# Patient Record
Sex: Female | Born: 1935 | Race: White | Hispanic: No | Marital: Married | Smoking: Former smoker
Health system: Southern US, Community
[De-identification: ages and names within clinical notes are randomized; demographics above are authoritative.]

## PROBLEM LIST (undated history)

## (undated) DIAGNOSIS — I714 Abdominal aortic aneurysm, without rupture, unspecified: Secondary | ICD-10-CM

## (undated) DIAGNOSIS — E039 Hypothyroidism, unspecified: Secondary | ICD-10-CM

## (undated) DIAGNOSIS — M069 Rheumatoid arthritis, unspecified: Secondary | ICD-10-CM

## (undated) DIAGNOSIS — I480 Paroxysmal atrial fibrillation: Secondary | ICD-10-CM

## (undated) DIAGNOSIS — S32000A Wedge compression fracture of unspecified lumbar vertebra, initial encounter for closed fracture: Secondary | ICD-10-CM

## (undated) DIAGNOSIS — R918 Other nonspecific abnormal finding of lung field: Secondary | ICD-10-CM

## (undated) HISTORY — DX: Other nonspecific abnormal finding of lung field: R91.8

## (undated) HISTORY — DX: Abdominal aortic aneurysm, without rupture, unspecified: I71.40

## (undated) HISTORY — PX: ABDOMINAL HYSTERECTOMY: SHX81

## (undated) HISTORY — DX: Hypothyroidism, unspecified: E03.9

## (undated) HISTORY — DX: Wedge compression fracture of unspecified lumbar vertebra, initial encounter for closed fracture: S32.000A

## (undated) HISTORY — DX: Abdominal aortic aneurysm, without rupture: I71.4

## (undated) HISTORY — DX: Rheumatoid arthritis, unspecified: M06.9

## (undated) HISTORY — PX: TONSILLECTOMY: SUR1361

## (undated) HISTORY — PX: CHOLECYSTECTOMY: SHX55

## (undated) HISTORY — DX: Paroxysmal atrial fibrillation: I48.0

---

## 2010-06-20 ENCOUNTER — Inpatient Hospital Stay (HOSPITAL_COMMUNITY)
Admission: AD | Admit: 2010-06-20 | Discharge: 2010-06-23 | Payer: Self-pay | Source: Home / Self Care | Admitting: Internal Medicine

## 2010-06-20 ENCOUNTER — Ambulatory Visit: Payer: Self-pay | Admitting: Internal Medicine

## 2010-06-20 ENCOUNTER — Ambulatory Visit: Payer: Self-pay | Admitting: Cardiology

## 2010-06-22 ENCOUNTER — Ambulatory Visit: Payer: Self-pay | Admitting: Vascular Surgery

## 2010-07-07 ENCOUNTER — Encounter: Payer: Self-pay | Admitting: Cardiology

## 2010-07-11 ENCOUNTER — Encounter: Payer: Self-pay | Admitting: Cardiology

## 2010-07-11 ENCOUNTER — Ambulatory Visit: Payer: Self-pay | Admitting: Cardiology

## 2010-07-11 DIAGNOSIS — I714 Abdominal aortic aneurysm, without rupture: Secondary | ICD-10-CM

## 2010-07-11 DIAGNOSIS — R911 Solitary pulmonary nodule: Secondary | ICD-10-CM

## 2010-07-11 DIAGNOSIS — I4891 Unspecified atrial fibrillation: Secondary | ICD-10-CM

## 2010-09-06 NOTE — Assessment & Plan Note (Signed)
Summary: nph/a-fib/lg   Primary Provider:  Dr. Donata Duff   History of Present Illness: 77 yo with history of rheumatoid arthritis presents for followup of newly-diagnosed paroxysmal atrial fibrillation.  Patient went to [facility] in 11/11 with symptomatic atrial fibrillation with rapid ventricular resopnse.  She initially received IV diltiazem and became hypotensive, requiring pressors.  She was transferred to [facility].  At [facility], she converted back to NSR, then went back into atrial fibrillation.  She was rate controlled and anticoagulated.  We discussed starting Pradaxa, but she wanted to go on coumadin instead because of costs, so coumadin was started.  Echo showed EF 50-55% with no wall motion abnormalities and elevated PA pressure consistent with diastolic CHF.  Since discharge, she has been doing quite well.  She is in sinus rhythm today.  No episodes of racing heart rate or palpitations.  Coumadin is followed in [place].  No chest pain.  She is doing housework and walking on flat ground without dyspnea.    ECG: NSR, delayed R wave progression  Labs (11/11): K 3.4, creatinine 1, TSH normal, cardiac enzymes negative, LDL 73, HDL 37  Current Medications (verified): 1)  Warfarin Sodium 5 Mg Tabs (Warfarin Sodium) .... One Daily or As Directed 2)  Levoxyl 125 Mcg Tabs (Levothyroxine Sodium) .... One Daily 3)  Nortriptyline Hcl 25 Mg Caps (Nortriptyline Hcl) .Marland Kitchen.. 1 By Mouth Daily 4)  Folic Acid 400 Mcg Tabs (Folic Acid) .Marland Kitchen.. 1 By Mouth Daily 5)  Calcium 500 Mg Tabs (Calcium) .Marland Kitchen.. 1 By Mouth Daily 6)  Vitamin D 400 Unit Caps (Cholecalciferol) .Marland Kitchen.. 1 By Mouth Daily 7)  Toprol Xl 50 Mg Xr24h-Tab (Metoprolol Succinate) .Marland Kitchen.. 1 By Mouth Daily 8)  Zyrtec Allergy 10 Mg Tabs (Cetirizine Hcl) .... As Needed 9)  Cymbalta 60 Mg Cpep (Duloxetine Hcl) .Marland Kitchen.. 1 By Mouth Dialy 10)  Tramadol Hcl 50 Mg Tabs (Tramadol Hcl) .... As Needed 11)  Lunesta 3 Mg Tabs (Eszopiclone) .... As  Needed  Allergies (verified): 1)  ! Sulfa  Past History:  Past Medical History:  1. Paroxysmal atrial fibrillation with rapid ventricular response.  CHADSVASC score = 3 so needs anticoagulation. Echo (11/11): Mild LV hypertrophy, EF 50-55% without wall motion abnormalities, mild MR, PA systolic pressure 55 mmHg (moderately elevated).   2. Rheumatoid arthritis 3. Lung nodules on chest CT (11/11) 4. Hypothyroidism 5. h/o cholecystectomy 6. h/o hysterectomy 7. AAA: 3.4 cm on abdominal CT in 11/11 8. Lumbar compression fracture  Family History: No premature CAD, no atrial fibrillation  Social History: Reviewed history from 07/08/2010 and no changes required. The patient lives in [place] with her husband.  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  She is a former smoker.  She quit in 1989.   No alcohol or drug use  Review of Systems       All systems reviewed and negative except as per HPI.   Vital Signs:  Patient profile:   77 year old female Height:      66 inches Weight:      153 pounds BMI:     24.78 Pulse rate:   59 / minute Resp:     16 per minute BP sitting:   132 / 70  (right arm)  Vitals Entered By: Marrion Coy, CNA (July 11, 2010 11:40 AM)  Physical Exam  General:  Well developed, well nourished, in no acute distress. Head:  normocephalic and atraumatic Nose:  no deformity, discharge,  inflammation, or lesions Mouth:  Teeth, gums and palate normal. Oral mucosa normal. Neck:  Neck supple, no JVD. No masses, thyromegaly or abnormal cervical nodes. Lungs:  Clear bilaterally to auscultation and percussion. Heart:  Non-displaced PMI, chest non-tender; regular rate and rhythm, S1, S2 without murmurs, rubs or gallops. Carotid upstroke normal, no bruit. Pedals normal pulses. No edema, no varicosities. Abdomen:  Bowel sounds positive; abdomen soft and non-tender without masses, organomegaly, or hernias noted. No hepatosplenomegaly. Extremities:   No clubbing or cyanosis. Neurologic:  Alert and oriented x 3. Skin:  Mild bruising Psych:  Normal affect.   Impression & Recommendations:  Problem # 1:  ATRIAL FIBRILLATION (ICD-427.31) Paroxysmal.  She is currently in NSR.  She has had no dyspnea, chest pain, or palpitations since discharge.  Continue Toprol XL.  She is now interested in dabigatran (suspect that the cost compared to coumadin will be similar when taking into account the INR checks).  She has a CHADSVASC score of 3, so I think she would benefit from anticoagulation.  I will have her stop coumadin now and begin dabigatran when INR<2.  Her creatinine is normal. CBC and BMET after being on dabigatran for 2 wks.   Problem # 2:  PULMONARY NODULE (ICD-518.89) Given history of smoking, noncontrast chest CT in 5/12 to evaluate.   Problem # 3:  AAA (ICD-441.4) 3.4 cm infrarenal AAA in 11/11 on CT abdomen.  Will get abdominal US to follow in 11/12.   Followup in 3 months.    Other Orders: CT Scan  (CT Scan) Abdominal Aorta Duplex (Abd Aorta Duplex)  Patient Instructions: 1)  Your physician has recommended you make the following change in your medication:  2)  Change from Coumadin to Pradaxa when your INR is less than 2--when your INR is less than 2 start Pradaxa 150mg  twice a day. 3)  Have lab done 10 days after you change from Coumadin(warfarin) to Pradaxa---BMP/CBC  427.31--you have the order-please fax the results to [phone]. 4)  Your physician recommends that you schedule a follow-up appointment in: 3 months with Dr Shirlee Latch. 5)  Non-Cardiac CT scanning, (CAT scanning), is a noninvasive, special x-ray that produces cross-sectional images of the body using x-rays and a computer. CT scans help physicians diagnose and treat medical conditions. For some CT exams, a contrast material is used to enhance visibility in the area of the body being studied. CT scans provide greater clarity and reveal more details than regular x-ray  exams. CHEST CT in MAY 2012 to followup on pulmonary nodule 6)  Your physician has requested that you have an abdominal aorta duplex. During this test, an ultrasound is used to evaluate the aorta. Allow 30 minutes for this exam. Do not eat after midnight the day before and avoid carbonated beverages. There are no restrictions or special instructions.  NOVEMBER 2012 Prescriptions: PRADAXA 150 MG CAPS (DABIGATRAN ETEXILATE MESYLATE) one twice a day--DO NOT START UNTIL YOUR INR IS LESS THAN 2  #60 x 0   Entered by:   Katina Dung, RN, BSN   Authorized by:   [name], MD   Signed by:   Katina Dung, RN, BSN on 07/11/2010   Method used:   Electronically to        [id number] (retail)       [address]       [place], [place]  [address]       Ph: [phone]       Fax: [phone]  RxID:   [id number]

## 2010-09-06 NOTE — Consult Note (Signed)
Summary: Consultation Report  Consultation Report   Imported By: Earl Many 07/07/2010 14:51:59  _____________________________________________________________________  External Attachment:    Type:   Image     Comment:   External Document

## 2010-10-04 ENCOUNTER — Other Ambulatory Visit: Payer: Self-pay | Admitting: Cardiology

## 2010-10-04 ENCOUNTER — Encounter: Payer: Self-pay | Admitting: Cardiology

## 2010-10-04 ENCOUNTER — Ambulatory Visit (INDEPENDENT_AMBULATORY_CARE_PROVIDER_SITE_OTHER): Payer: Medicare Other | Admitting: Cardiology

## 2010-10-04 DIAGNOSIS — I4891 Unspecified atrial fibrillation: Secondary | ICD-10-CM

## 2010-10-04 DIAGNOSIS — R911 Solitary pulmonary nodule: Secondary | ICD-10-CM

## 2010-10-05 LAB — BASIC METABOLIC PANEL
BUN: 19 mg/dL (ref 6–23)
CO2: 29 mEq/L (ref 19–32)
Chloride: 104 mEq/L (ref 96–112)
Creatinine, Ser: 1.1 mg/dL (ref 0.4–1.2)
Potassium: 4.9 mEq/L (ref 3.5–5.1)

## 2010-10-05 LAB — CBC WITH DIFFERENTIAL/PLATELET
Basophils Relative: 0.3 % (ref 0.0–3.0)
Eosinophils Absolute: 0.2 10*3/uL (ref 0.0–0.7)
HCT: 38.3 % (ref 36.0–46.0)
Lymphs Abs: 1.9 10*3/uL (ref 0.7–4.0)
MCHC: 34 g/dL (ref 30.0–36.0)
MCV: 97 fl (ref 78.0–100.0)
Monocytes Absolute: 0.3 10*3/uL (ref 0.1–1.0)
Neutro Abs: 4.4 10*3/uL (ref 1.4–7.7)
Neutrophils Relative %: 64.3 % (ref 43.0–77.0)
RBC: 3.95 Mil/uL (ref 3.87–5.11)

## 2010-10-13 NOTE — Assessment & Plan Note (Signed)
Summary: 3 MONTH ROV/SL/RS PER PT-MJ/AMD   Primary Provider:  Dr. Donata Duff   History of Present Illness: 77 yo with history of rheumatoid arthritis presents for followup of paroxysmal atrial fibrillation.  Patient went to [facility] in 11/11 with symptomatic atrial fibrillation with rapid ventricular resopnse.  She initially received IV diltiazem and became hypotensive, requiring pressors.  She was transferred to [facility].  At [facility], she converted back to NSR, then went back into atrial fibrillation.  Echo showed EF 50-55% with no wall motion abnormalities and elevated PA pressure consistent with diastolic CHF.  She is now on Pradaxa. At this appointment and the last appointment, she has been in NSR.  She denies tachypalpitations. No chest pain or significant exertional dyspnea.   Labs (11/11): K 3.4, creatinine 1, TSH normal, cardiac enzymes negative, LDL 73, HDL 37  Current Medications (verified): 1)  Pradaxa 150 Mg Caps (Dabigatran Etexilate Mesylate) .... One Twice A Day--Do Not Start Until Your Inr Is Less Than 2 2)  Levoxyl 125 Mcg Tabs (Levothyroxine Sodium) .... One Daily 3)  Nortriptyline Hcl 25 Mg Caps (Nortriptyline Hcl) .Marland Kitchen.. 1 By Mouth Daily 4)  Folic Acid 400 Mcg Tabs (Folic Acid) .Marland Kitchen.. 1 By Mouth Daily 5)  Calcium 500 Mg Tabs (Calcium) .Marland Kitchen.. 1 By Mouth Daily 6)  Vitamin D 400 Unit Caps (Cholecalciferol) .Marland Kitchen.. 1 By Mouth Daily 7)  Toprol Xl 50 Mg Xr24h-Tab (Metoprolol Succinate) .Marland Kitchen.. 1 By Mouth Daily 8)  Zyrtec Allergy 10 Mg Tabs (Cetirizine Hcl) .... As Needed 9)  Tramadol Hcl 50 Mg Tabs (Tramadol Hcl) .... As Needed  Allergies (verified): 1)  ! Sulfa  Past History:  Past Medical History: Reviewed history from 07/11/2010 and no changes required.  1. Paroxysmal atrial fibrillation with rapid ventricular response.  CHADSVASC score = 3 so needs anticoagulation. Echo (11/11): Mild LV hypertrophy, EF 50-55% without wall motion abnormalities, mild MR, PA  systolic pressure 55 mmHg (moderately elevated).   2. Rheumatoid arthritis 3. Lung nodules on chest CT (11/11) 4. Hypothyroidism 5. h/o cholecystectomy 6. h/o hysterectomy 7. AAA: 3.4 cm on abdominal CT in 11/11 8. Lumbar compression fracture  Family History: Reviewed history from 07/11/2010 and no changes required. No premature CAD, no atrial fibrillation  Social History: Reviewed history from 07/08/2010 and no changes required. The patient lives in [place] with her husband.  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  She is a former smoker.  She quit in 1989.   No alcohol or drug use  Vital Signs:  Patient profile:   77 year old female Height:      66 inches Weight:      147 pounds BMI:     23.81 Pulse rate:   80 / minute Resp:     16 per minute BP sitting:   139 / 78  (right arm)  Vitals Entered By: Marrion Coy, CNA (October 04, 2010 2:45 PM)  Physical Exam  General:  Well developed, well nourished, in no acute distress. Neck:  Neck supple, no JVD. No masses, thyromegaly or abnormal cervical nodes. Lungs:  Clear bilaterally to auscultation and percussion. Heart:  Non-displaced PMI, chest non-tender; regular rate and rhythm, S1, S2 without murmurs, rubs or gallops. Carotid upstroke normal, no bruit. Pedals normal pulses. No edema, no varicosities. Abdomen:  Bowel sounds positive; abdomen soft and non-tender without masses, organomegaly, or hernias noted. No hepatosplenomegaly. Extremities:  No clubbing or cyanosis. Neurologic:  Alert and oriented x 3.  Psych:  Normal affect.   Impression & Recommendations:  Problem # 1:  ATRIAL FIBRILLATION (ICD-427.31) Paroxysmal atrial fibrillation with CHADSVASC score = 3.  Would continue Pradaxa and Toprol XL.  Check CBC and BMET on Pradaxa.   Problem # 2:  PULMONARY NODULE (ICD-518.89) Given history of smoking, noncontrast chest CT in 5/12 to evaluate.   Problem # 3:  AAA (ICD-441.4) 3.4 cm infrarenal  AAA in 11/11 on CT abdomen.  Will get abdominal US to follow in 11/12.   Other Orders: Abdominal Aorta Duplex (Abd Aorta Duplex) CT Scan  (CT Scan) TLB-BMP (Basic Metabolic Panel-BMET) (80048-METABOL) TLB-CBC Platelet - w/Differential (85025-CBCD)  Patient Instructions: 1)  Your physician recommends that you have lab today---BMP CBC  427.31 2)  Non-Cardiac CT scanning, (CAT scanning), is a noninvasive, special x-ray that produces cross-sectional images of the body using x-rays and a computer. CT scans help physicians diagnose and treat medical conditions. For some CT exams, a contrast material is used to enhance visibility in the area of the body being studied. CT scans provide greater clarity and reveal more details than regular x-ray exams. CHEST CT MAY 2012 3)  Your physician has requested that you have an abdominal aorta duplex. During this test, an ultrasound is used to evaluate the aorta. Allow 30 minutes for this exam. Do not eat after midnight the day before and avoid carbonated beverages. There are no restrictions or special instructions. NOVEMBER 2012 4)  Your physician wants you to follow-up in: 6 months with Dr Shirlee Latch.[place] No 2012) You will receive a reminder letter in the mail two months in advance. If you don't receive a letter, please call our office to schedule the follow-up appointment.

## 2010-10-18 LAB — URINALYSIS, ROUTINE W REFLEX MICROSCOPIC
Bilirubin Urine: NEGATIVE
Glucose, UA: NEGATIVE mg/dL
Ketones, ur: NEGATIVE mg/dL
Protein, ur: NEGATIVE mg/dL
pH: 5.5 (ref 5.0–8.0)

## 2010-10-18 LAB — BASIC METABOLIC PANEL
BUN: 12 mg/dL (ref 6–23)
BUN: 17 mg/dL (ref 6–23)
CO2: 23 mEq/L (ref 19–32)
CO2: 23 mEq/L (ref 19–32)
CO2: 27 mEq/L (ref 19–32)
Calcium: 8.1 mg/dL — ABNORMAL LOW (ref 8.4–10.5)
Calcium: 8.1 mg/dL — ABNORMAL LOW (ref 8.4–10.5)
Chloride: 108 mEq/L (ref 96–112)
Creatinine, Ser: 0.86 mg/dL (ref 0.4–1.2)
Creatinine, Ser: 1.02 mg/dL (ref 0.4–1.2)
GFR calc Af Amer: >60 mL/min (ref >60)
GFR calc non Af Amer: 52 mL/min — ABNORMAL LOW (ref >60)
GFR calc non Af Amer: 53 mL/min — ABNORMAL LOW (ref >60)
Glucose, Bld: 102 mg/dL — ABNORMAL HIGH (ref 70–99)
Glucose, Bld: 144 mg/dL — ABNORMAL HIGH (ref 70–99)
Glucose, Bld: 95 mg/dL (ref 70–99)
Potassium: 3.4 mEq/L — ABNORMAL LOW (ref 3.5–5.1)
Potassium: 3.9 mEq/L (ref 3.5–5.1)
Sodium: 137 mEq/L (ref 135–145)
Sodium: 139 mEq/L (ref 135–145)

## 2010-10-18 LAB — PROTIME-INR
INR: 1.08 (ref 0.00–1.49)
INR: 1.13 (ref 0.00–1.49)

## 2010-10-18 LAB — T4, FREE: Free T4: 1.46 ng/dL (ref 0.80–1.80)

## 2010-10-18 LAB — CBC
HCT: 32.6 % — ABNORMAL LOW (ref 36.0–46.0)
HCT: 34.6 % — ABNORMAL LOW (ref 36.0–46.0)
Hemoglobin: 11 g/dL — ABNORMAL LOW (ref 12.0–15.0)
Hemoglobin: 11.5 g/dL — ABNORMAL LOW (ref 12.0–15.0)
MCH: 31.6 pg (ref 26.0–34.0)
MCH: 31.6 pg (ref 26.0–34.0)
MCHC: 33.1 g/dL (ref 30.0–36.0)
MCHC: 33.2 g/dL (ref 30.0–36.0)
MCHC: 33.7 g/dL (ref 30.0–36.0)
MCV: 93.7 fL (ref 78.0–100.0)
Platelets: 172 10*3/uL (ref 150–400)
Platelets: 187 10*3/uL (ref 150–400)
RBC: 3.48 MIL/uL — ABNORMAL LOW (ref 3.87–5.11)
RDW: 13.1 % (ref 11.5–15.5)
WBC: 5.1 10*3/uL (ref 4.0–10.5)

## 2010-10-18 LAB — LIPID PANEL
HDL: 37 mg/dL — ABNORMAL LOW (ref >39)
Triglycerides: 106 mg/dL (ref <150)
VLDL: 21 mg/dL (ref 0–40)

## 2010-10-18 LAB — COMPREHENSIVE METABOLIC PANEL
ALT: 10 U/L (ref 0–35)
AST: 19 U/L (ref 0–37)
Albumin: 2.8 g/dL — ABNORMAL LOW (ref 3.5–5.2)
Alkaline Phosphatase: 45 U/L (ref 39–117)
BUN: 20 mg/dL (ref 6–23)
CO2: 23 mEq/L (ref 19–32)
Calcium: 8.1 mg/dL — ABNORMAL LOW (ref 8.4–10.5)
Chloride: 100 mEq/L (ref 96–112)
Creatinine, Ser: 1.15 mg/dL (ref 0.4–1.2)
GFR calc Af Amer: 55 mL/min — ABNORMAL LOW (ref >60)
GFR calc non Af Amer: 45 mL/min — ABNORMAL LOW (ref >60)
Glucose, Bld: 162 mg/dL — ABNORMAL HIGH (ref 70–99)
Potassium: 4.2 mEq/L (ref 3.5–5.1)
Sodium: 130 mEq/L — ABNORMAL LOW (ref 135–145)
Total Bilirubin: 0.6 mg/dL (ref 0.3–1.2)
Total Protein: 6.7 g/dL (ref 6.0–8.3)

## 2010-10-18 LAB — HEMOGLOBIN A1C: Hgb A1c MFr Bld: 6.1 % — ABNORMAL HIGH (ref <5.7)

## 2010-10-18 LAB — RHEUMATOID FACTOR: Rheumatoid fact SerPl-aCnc: 33 IU/mL — ABNORMAL HIGH (ref 0–20)

## 2010-10-18 LAB — CARDIAC PANEL(CRET KIN+CKTOT+MB+TROPI)
CK, MB: 1.1 ng/mL (ref 0.3–4.0)
CK, MB: 1.2 ng/mL (ref 0.3–4.0)
CK, MB: 2.1 ng/mL (ref 0.3–4.0)
Relative Index: INVALID (ref 0.0–2.5)
Relative Index: INVALID (ref 0.0–2.5)
Total CK: 69 U/L (ref 7–177)
Total CK: 71 U/L (ref 7–177)
Total CK: 81 U/L (ref 7–177)
Troponin I: 0.01 ng/mL (ref 0.00–0.06)
Troponin I: 0.03 ng/mL (ref 0.00–0.06)

## 2010-10-18 LAB — VITAMIN D 1,25 DIHYDROXY
Vitamin D 1, 25 (OH)2 Total: 27 pg/mL (ref 18–72)
Vitamin D2 1, 25 (OH)2: <8 pg/mL

## 2010-10-18 LAB — URINE MICROSCOPIC-ADD ON

## 2010-10-18 LAB — CORTISOL: Cortisol, Plasma: 5.4 ug/dL

## 2010-10-18 LAB — VITAMIN B12 BINDING CAPACITY, BLOOD: Vitamin B12 Bind Capacity: 1409 pg/mL — ABNORMAL HIGH (ref 650–1340)

## 2010-10-18 LAB — SEDIMENTATION RATE: Sed Rate: 43 mm/hr — ABNORMAL HIGH (ref 0–22)

## 2010-10-18 LAB — TSH: TSH: 0.762 u[IU]/mL (ref 0.350–4.500)

## 2010-10-25 ENCOUNTER — Ambulatory Visit: Payer: Self-pay | Admitting: Cardiology

## 2010-12-15 ENCOUNTER — Ambulatory Visit (INDEPENDENT_AMBULATORY_CARE_PROVIDER_SITE_OTHER)
Admission: RE | Admit: 2010-12-15 | Discharge: 2010-12-15 | Disposition: A | Discharge status: Home / Self Care | Payer: Medicare Other | Source: Ambulatory Visit | Attending: Cardiology | Admitting: Cardiology

## 2010-12-15 DIAGNOSIS — R911 Solitary pulmonary nodule: Secondary | ICD-10-CM

## 2010-12-15 DIAGNOSIS — J984 Other disorders of lung: Secondary | ICD-10-CM

## 2010-12-20 ENCOUNTER — Other Ambulatory Visit: Payer: Self-pay | Admitting: *Deleted

## 2010-12-20 DIAGNOSIS — R911 Solitary pulmonary nodule: Secondary | ICD-10-CM

## 2011-02-13 ENCOUNTER — Telehealth: Payer: Self-pay | Admitting: Cardiology

## 2011-02-13 NOTE — Telephone Encounter (Signed)
All Cardiac faxed to [facility]/Dr.Amy Csorba  @ [phone]  02/13/11/km

## 2011-03-27 ENCOUNTER — Encounter: Payer: Self-pay | Admitting: Cardiology

## 2011-04-17 ENCOUNTER — Encounter: Payer: Self-pay | Admitting: Cardiology

## 2011-04-17 ENCOUNTER — Ambulatory Visit (INDEPENDENT_AMBULATORY_CARE_PROVIDER_SITE_OTHER): Payer: Medicare Other | Admitting: Cardiology

## 2011-04-17 DIAGNOSIS — I714 Abdominal aortic aneurysm, without rupture: Secondary | ICD-10-CM

## 2011-04-17 DIAGNOSIS — I4891 Unspecified atrial fibrillation: Secondary | ICD-10-CM

## 2011-04-17 DIAGNOSIS — J984 Other disorders of lung: Secondary | ICD-10-CM

## 2011-04-17 NOTE — Assessment & Plan Note (Signed)
Asymptomatic. Scheduled for abdominal ultrasound to re-assess in November 2012.

## 2011-04-17 NOTE — Patient Instructions (Signed)
Your physician recommends that you have lab today--BMP 427.31  Schedule an appointment for an ultrasound of your aorta. November 2012  Schedule an appointment for a CT of your chest. May 2013  Take and record your blood pressure 2-3 times a week. Call our office if the top number of your blood pressure in regularly over 140. Luana Shu [phone]  Your physician wants you to follow-up in: 6 months with Dr Shirlee Latch. (March 2013). You will receive a reminder letter in the mail two months in advance. If you don't receive a letter, please call our office to schedule the follow-up appointment.

## 2011-04-17 NOTE — Assessment & Plan Note (Signed)
Although patient does have history of tobacco abuse, this is likely benign finding based on CT. Will get recommended repeat CT-chest in May 2013.

## 2011-04-17 NOTE — Assessment & Plan Note (Signed)
Paroxysmal atrial fibrillation not in atrial fibrillation today. Asymptomatic. Continue Pradaxa and Toprol at current dose. Will check Cr today since on Pradaxa.  Blood pressure found to be higher than usual for patient whose systolics are usually less than 140. May be element of white coat hypertension. Asked her to check blood pressure a few times in the next week and to call the clinic if consistently above 140s. May need additional anti-hypertensive.  Follow-up in 6 months.

## 2011-04-17 NOTE — Progress Notes (Signed)
  Subjective:    Patient ID: Debbie Parrish, female    DOB: 15-Dec-1926, 77 y.o.   MRN: [id number]  HPI Patient with history of paroxysmal atrial fibrillation diagnosed November 2011 when she presented to outside hospital with symptomatic atrial fibrillation with RVR. Came to the ED due to severe shoulder pain. Last seen in February 2012 by Dr. Shirlee Latch. Patient reports no new complaints since then. Denies chest pain, palpitations, dyspnea, lower extremity swelling.  She reports compliance with Pradaxa and Toprol.   Not smoking.  Review of Systems See HPI.    Objective:   Physical Exam Gen: NAD Psych: somewhat anxious appearing CV: RRR, normal S1/S2 with no murmurs or gallops; no JVD or carotid bruits auscultated Pulm: CTAB, no wheezes or rales Ext: no cyanosis or pedal edema MSK: no joint erythema or swelling    Assessment & Plan:

## 2011-04-18 LAB — BASIC METABOLIC PANEL
BUN: 25 mg/dL — ABNORMAL HIGH (ref 6–23)
Chloride: 101 mEq/L (ref 96–112)
Potassium: 4.4 mEq/L (ref 3.5–5.1)
Sodium: 138 mEq/L (ref 135–145)

## 2011-04-18 NOTE — Progress Notes (Signed)
I saw the patient with the resident and agree with note.  No tachypalpitations concerning for recurrent atrial fibrillation.  She is in NSR today.  - f/u chest CT w/o contrast in 5/13 - f/u abdominal US in 11/12 for AAA - BMET today - Continue Pradaxa.

## 2011-04-26 ENCOUNTER — Other Ambulatory Visit: Payer: Self-pay | Admitting: Cardiology

## 2011-06-22 ENCOUNTER — Telehealth: Payer: Self-pay | Admitting: Cardiology

## 2011-06-22 ENCOUNTER — Encounter (INDEPENDENT_AMBULATORY_CARE_PROVIDER_SITE_OTHER): Payer: Medicare Other | Admitting: Cardiology

## 2011-06-22 DIAGNOSIS — I714 Abdominal aortic aneurysm, without rupture: Secondary | ICD-10-CM

## 2011-06-22 NOTE — Telephone Encounter (Signed)
I talked with pt's daughter about pradaxa. Pt's daughter was concerned about bruising from pradaxa. She was asking if dose could be reduced. I reviewed with Dr Shirlee Latch. He would not recommend changing the current pradaxa dose. Dr Shirlee Latch recommended changing pradaxa to rivoroxaban 15mg  daily and that may cause less bruising. Pt's daughter elected to make an appt with Dr Shirlee Latch for pt to discuss changing medication. I have made pt an appt for 07/19/11 at 3:45 for pt to see Dr Shirlee Latch.

## 2011-06-22 NOTE — Telephone Encounter (Signed)
Pt's dtr calling to give info to pt's nurse

## 2011-07-19 ENCOUNTER — Ambulatory Visit: Payer: Medicare Other | Admitting: Cardiology

## 2011-08-28 ENCOUNTER — Telehealth: Payer: Self-pay | Admitting: Cardiology

## 2011-08-28 DIAGNOSIS — I4891 Unspecified atrial fibrillation: Secondary | ICD-10-CM

## 2011-08-28 NOTE — Telephone Encounter (Signed)
08/28/11--pt's dasughter calling stating her mom is ready to go back on anticoags, but dr Shirlee Latch mentioned switching to a different anticoag (she experiences nausea and much bruising on pradaxa) at last o.v. And daughter wants to try something else--advised i would pass message along, as i'm concerned about how long she will be off anticoags--daughter agrees and would like to be notified ASAP--thanks nt

## 2011-08-28 NOTE — Telephone Encounter (Signed)
New Problem:    Patient's daughter called because her mother had eye surgery and had to come off of pradaxa and has not started taking it again.  Was wondering if she should start taking her pradaxa again or if she should start taking that new blood thinner that Dr. Shirlee Latch offered to switch her too. Patient has problems with bruising. Patient is also having some chest pain but the daughter believes its a gastrointestinal issue. Please call back.

## 2011-08-29 NOTE — Telephone Encounter (Signed)
I talked with pt's daughter,Catherine. Pt had eyelid surgery last Wed and has been off Pradaxa since then. After discussion with pt's daughter she  has decided to have pt restart pradaxa after getting OK from surgeon rather than change to Xarelto right now.  Pt's daughter did mention pt has had a chronic headache /skull ache each morning when she wakes up that pt has continued to have off Pradaxa. She denies any sudden severe headache, facial or mouth  drooping, vision or speech problems. Pt's daughter states pt has nausea and increased reflux on Pradaxa. She is on other arthritis medications that may have the same side effects. Pt's daughter will follow-up with pt's PCP regarding pt's headaches. Pt's daughter is requesting appt with Dr Shirlee Latch to discuss taking Xarelto instead of Pradaxa. I have given pt an appt 09/11/11 and will order CBC/BMET to be done at appt 09/11/11.

## 2011-08-29 NOTE — Telephone Encounter (Signed)
She could restart Pradaxa.  Other option would be Xarelto 15 mg daily (dosed for GFR about 45).  She needs a BMET and CBC soon.

## 2011-08-30 NOTE — Telephone Encounter (Signed)
Would let her know that Xarelto is less likely to have the GI side effects.

## 2011-09-11 ENCOUNTER — Ambulatory Visit (INDEPENDENT_AMBULATORY_CARE_PROVIDER_SITE_OTHER): Payer: Medicare Other | Admitting: Cardiology

## 2011-09-11 ENCOUNTER — Telehealth: Payer: Self-pay | Admitting: Cardiology

## 2011-09-11 ENCOUNTER — Other Ambulatory Visit: Payer: Medicare Other | Admitting: *Deleted

## 2011-09-11 ENCOUNTER — Encounter: Payer: Self-pay | Admitting: Cardiology

## 2011-09-11 DIAGNOSIS — I714 Abdominal aortic aneurysm, without rupture: Secondary | ICD-10-CM

## 2011-09-11 DIAGNOSIS — I4891 Unspecified atrial fibrillation: Secondary | ICD-10-CM

## 2011-09-11 DIAGNOSIS — J984 Other disorders of lung: Secondary | ICD-10-CM

## 2011-09-11 DIAGNOSIS — R0989 Other specified symptoms and signs involving the circulatory and respiratory systems: Secondary | ICD-10-CM

## 2011-09-11 DIAGNOSIS — R918 Other nonspecific abnormal finding of lung field: Secondary | ICD-10-CM

## 2011-09-11 DIAGNOSIS — R51 Headache: Secondary | ICD-10-CM

## 2011-09-11 DIAGNOSIS — R0609 Other forms of dyspnea: Secondary | ICD-10-CM

## 2011-09-11 DIAGNOSIS — F329 Major depressive disorder, single episode, unspecified: Secondary | ICD-10-CM

## 2011-09-11 MED ORDER — DULOXETINE HCL 30 MG PO CPEP: 30.0000 mg | ORAL_CAPSULE | Freq: Every day | ORAL | Status: DC

## 2011-09-11 MED ORDER — RIVAROXABAN 15 MG PO TABS: 15.0000 mg | ORAL_TABLET | Freq: Every day | ORAL | Status: DC

## 2011-09-11 NOTE — Telephone Encounter (Signed)
Dr Shirlee Latch saw pt 09/11/11. Pt's son,Charlie was with her today. Dr Shirlee Latch talked with pt and son today

## 2011-09-11 NOTE — Telephone Encounter (Signed)
FU Call: Pt daughter calling wanting to make sure nurse/MD knows about pt headaches. Pt has them everyday. Pt also has nausea that comes and goes. Based on pt headaches pt daughter wanted to know if pt needed any tests to be scheduled.   Pt daughter also wanted to know if pt can get some lab work done today.  MD had also mentioned pt switching pradaxa to xarelto-Pt daughter doesn't want RX's to be changed if there are no significant side effect changes.   Pt is willing to go off blood thinner, maybe consider aspirin therapy.   Please return pt daughter call to discuss further if necessary.

## 2011-09-11 NOTE — Patient Instructions (Signed)
Stop pradaxa.  Start Xarelto 15mg  daily. Do not take pradaxa for 1 day then start Xarelto.   Stop tramadol. Use tylenol for pain if necessary.  Start cymbalta 30mg  daily.  Your physician recommends that you have lab work today---BMET/BNP/CBC    Your physician has requested that you have an echocardiogram. Echocardiography is a painless test that uses sound waves to create images of your heart. It provides your doctor with information about the size and shape of your heart and how well your heart's chambers and valves are working. This procedure takes approximately one hour. There are no restrictions for this procedure.  Schedule an appointment for a CT of your head.  Schedule an appointment for a CT of your chest to follow-up on pulmonary nodules. MAY 2013.  Your physician recommends that you schedule a follow-up appointment in: 3 months with Dr Shirlee Latch.

## 2011-09-11 NOTE — Telephone Encounter (Signed)
Per daughter pt has an appt today and daughter wants to talk to you prior to appt regarding something that was mentioned about a month ago at another visit

## 2011-09-12 ENCOUNTER — Telehealth: Payer: Self-pay | Admitting: *Deleted

## 2011-09-12 DIAGNOSIS — F329 Major depressive disorder, single episode, unspecified: Secondary | ICD-10-CM | POA: Insufficient documentation

## 2011-09-12 DIAGNOSIS — F32A Depression, unspecified: Secondary | ICD-10-CM | POA: Insufficient documentation

## 2011-09-12 LAB — CBC WITH DIFFERENTIAL/PLATELET
Basophils Relative: 0.2 % (ref 0.0–3.0)
Eosinophils Absolute: 0.1 10*3/uL (ref 0.0–0.7)
Eosinophils Relative: 1.5 % (ref 0.0–5.0)
HCT: 39.9 % (ref 36.0–46.0)
Hemoglobin: 13.5 g/dL (ref 12.0–15.0)
MCHC: 33.9 g/dL (ref 30.0–36.0)
MCV: 98.2 fl (ref 78.0–100.0)
Monocytes Absolute: 0.4 10*3/uL (ref 0.1–1.0)
Neutro Abs: 4.5 10*3/uL (ref 1.4–7.7)
RBC: 4.06 Mil/uL (ref 3.87–5.11)

## 2011-09-12 LAB — BASIC METABOLIC PANEL
CO2: 29 mEq/L (ref 19–32)
Calcium: 9.4 mg/dL (ref 8.4–10.5)
Chloride: 103 mEq/L (ref 96–112)
Glucose, Bld: 109 mg/dL — ABNORMAL HIGH (ref 70–99)
Potassium: 5.2 mEq/L — ABNORMAL HIGH (ref 3.5–5.1)
Sodium: 140 mEq/L (ref 135–145)

## 2011-09-12 NOTE — Assessment & Plan Note (Signed)
Mrs Jergens has noted increased fatigue and exertional dyspnea recently.  This could be related to depression.  She had evidence for diastolic dysfunction on her last echo but is not volume overloaded on exam today.  I will get an echo to make sure there has been no worsening of LV/RV function.  I will check a BNP.

## 2011-09-12 NOTE — Telephone Encounter (Signed)
Spoke with pt, confirmed she is not taking pamelor.

## 2011-09-12 NOTE — Progress Notes (Addendum)
PCP: Dr. Mora Appl in [place]  78 yo with history of rheumatoid arthritis presents for followup of paroxysmal atrial fibrillation. Patient went to [facility] in 11/11 with symptomatic atrial fibrillation with rapid ventricular resopnse. She initially received IV diltiazem and became hypotensive, requiring pressors. She was transferred to [facility]. At [facility], she converted back to NSR, then went back into atrial fibrillation. Echo showed EF 50-55% with no wall motion abnormalities and elevated PA pressure consistent with diastolic CHF. She is now on Pradaxa. At this appointment and the last 2 appointments, she has been in NSR. She denies tachypalpitations.   [name] comes in with her son today.  She has been having trouble for the last several months.  She has had a headache in the morning every day for 2-3 month.  Often this will resolve as the morning goes on.  She has been having memory trouble for the last 2-3 months.  Per her son, she was very sharp prior to this.  She has been more emotional.  Her mood is depressed.  Her husband has been having health problems and has been difficult for her to deal with at times.    She feels fatigued in general.  She is short of breath walking down her street, which is somewhat worse than in the past.  No chest pain.  She has been having a lot of bruising with Pradaxa.     Labs (11/11): K 3.4, creatinine 1, TSH normal, cardiac enzymes negative, LDL 73, HDL 37  Labs (9/12): K 4.4, creatinine 1.2   Allergies (verified):  1) ! Sulfa   Past Medical History:  1. Paroxysmal atrial fibrillation with rapid ventricular response. CHADSVASC score = 3 so needs anticoagulation. Echo (11/11): Mild LV hypertrophy, EF 50-55% without wall motion abnormalities, mild MR, PA systolic pressure 55 mmHg (moderately elevated).  2. Rheumatoid arthritis  3. Lung nodules on chest CT (11/11)  4. Hypothyroidism  5. h/o cholecystectomy  6. h/o hysterectomy  7. AAA: 3.4 cm  on abdominal CT in 11/11, 3.0 cm on ultrasound in 8/12.  8. Lumbar compression fracture  9. Depression  Family History:  No premature CAD, no atrial fibrillation   Social History:  The patient lives in [place] with her husband.  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 She quit in 1989.  No alcohol or drug use  ROS: All systems reviewed and negative except as per HPI.   Current Outpatient Prescriptions  Medication Sig Dispense Refill  . acetaminophen (TYLENOL) 500 MG tablet Take 500 mg by mouth every 6 (six) hours as needed.      . Calcium Carbonate (CALCIUM 500 PO) Take 1 tablet by mouth daily.        . cetirizine (ZYRTEC) 10 MG tablet Take 10 mg by mouth as needed.        . leflunomide (ARAVA) 10 MG tablet 1 tablet a day      . levothyroxine (SYNTHROID, LEVOTHROID) 125 MCG tablet Take 125 mcg by mouth daily.        . metoprolol (TOPROL-XL) 50 MG 24 hr tablet Take 50 mg by mouth daily.        . predniSONE (DELTASONE) 10 MG tablet Take 2.5 mg by mouth daily. As directed      . vitamin E 400 UNIT capsule Take 400 Units by mouth daily.        . DULoxetine (CYMBALTA) 30 MG capsule Take 1 capsule (  30 mg total) by mouth daily.  30 capsule  0  . nortriptyline (PAMELOR) 25 MG capsule Take 25 mg by mouth at bedtime.        . Rivaroxaban (XARELTO) 15 MG TABS tablet Take 1 tablet (15 mg total) by mouth daily.  30 tablet  6    BP 120/72  Pulse 62  Ht 5' 6.5" (1.689 m)  Wt 67.132 kg (148 lb)  BMI 23.53 kg/m2 General: NAD Neck: No JVD, no thyromegaly or thyroid nodule.  Lungs: Clear to auscultation bilaterally with normal respiratory effort. CV: Nondisplaced PMI.  Heart regular S1/S2, no S3/S4, no murmur.  No peripheral edema.  No carotid bruit.  Normal pedal pulses.  Abdomen: Soft, nontender, no hepatosplenomegaly, no distention.   Neurologic: Alert and oriented x 3.  Psych: depressed affect. Extremities: No clubbing or cyanosis.

## 2011-09-12 NOTE — Assessment & Plan Note (Signed)
Abdominal US to followup AAA in 8/13.

## 2011-09-12 NOTE — Assessment & Plan Note (Signed)
Chest CT to followup pulmonary nodules in 5/13.

## 2011-09-12 NOTE — Assessment & Plan Note (Addendum)
Mrs Reedy has a depressed mood.  She has developed memory difficulty as well as emotional lability over the last 2-3 months.  Given these findings, use of Pradaxa, and her chronic daily headaches for 2-3 months, I am going to get a noncontrast head CT to rule out a chronic subdural hematoma.  I am also going to start her on Cymbalta at 30 mg daily.  She should avoid tramadol while on Cymbalta.  Her memory difficulty and emotional lability could all be due to depression and may improve with Cymbalta.  However, if no improvement is seen, she may indeed have early dementia.  In that case, she will followup with her PCP for Aricept, Namenda, or Exelon.  I noted after appointment that she is on a low dose of nortriptyline.  If this is not a vital medicine, would have her stop this since we are starting Cymbalta.

## 2011-09-12 NOTE — Progress Notes (Signed)
Patient ID: Debbie Parrish, female   DOB: 14-Jun-1927, 78 y.o.   MRN: [id number]

## 2011-09-12 NOTE — Telephone Encounter (Signed)
Message copied by Freddi Starr on Tue Sep 12, 2011  4:17 PM ------      Message from: Laurey Morale      Created: Tue Sep 12, 2011 12:03 PM       I started Debbie Parrish on Cymbalta for depression.  I noticed that she is on a very low dose of nortriptyline.  Please call her and have her stop this now that she is on Cymbalta.

## 2011-09-12 NOTE — Assessment & Plan Note (Signed)
Paroxysmal atrial fibrillation.  She is in NSR today.  She has been bruising easily with Pradaxa. GFR is mildly decreased (creatinine clearance around 48).  Renally dosed Xarelto may have less bruising and less of a bleeding tendency in general.  I will have her stop Pradaxa and go on Xarelto 15 mg daily. Continue Toprol XL.

## 2011-09-13 ENCOUNTER — Ambulatory Visit (HOSPITAL_COMMUNITY): Payer: Medicare Other | Attending: Cardiovascular Disease | Admitting: Radiology

## 2011-09-13 ENCOUNTER — Telehealth: Payer: Self-pay | Admitting: *Deleted

## 2011-09-13 ENCOUNTER — Ambulatory Visit (INDEPENDENT_AMBULATORY_CARE_PROVIDER_SITE_OTHER)
Admission: RE | Admit: 2011-09-13 | Discharge: 2011-09-13 | Disposition: A | Discharge status: Home / Self Care | Payer: Medicare Other | Source: Ambulatory Visit | Attending: Internal Medicine | Admitting: Internal Medicine

## 2011-09-13 DIAGNOSIS — R51 Headache: Secondary | ICD-10-CM

## 2011-09-13 DIAGNOSIS — R0609 Other forms of dyspnea: Secondary | ICD-10-CM | POA: Insufficient documentation

## 2011-09-13 DIAGNOSIS — R0989 Other specified symptoms and signs involving the circulatory and respiratory systems: Secondary | ICD-10-CM | POA: Insufficient documentation

## 2011-09-13 DIAGNOSIS — I4891 Unspecified atrial fibrillation: Secondary | ICD-10-CM | POA: Insufficient documentation

## 2011-09-13 NOTE — Telephone Encounter (Signed)
[name], MD << Less Detail     [name], MD       Sent: Tue September 12, 2011 12:03 PM    To: Jacqlyn Krauss, RN    Cc: P L[facility] Triage       [name]    MRN: [id number] DOB: 10/15/1926    Pt Home: [phone]       09/13/11--I talked with pt. Pt states that she is not currently taking nortriptyline and has not taken it recently. Pt is aware that she should not take nortriptyline and Cymbalta.         Message     I started Debbie Parrish on Cymbalta for depression.  I noticed that she is on a very low dose of nortriptyline.  Please call her and have her stop this now that she is on Cymbalta.

## 2011-09-14 ENCOUNTER — Telehealth: Payer: Self-pay | Admitting: Cardiology

## 2011-09-14 MED ORDER — RIVAROXABAN 15 MG PO TABS: 15.0000 mg | ORAL_TABLET | Freq: Every day | ORAL | Status: DC

## 2011-09-14 NOTE — Telephone Encounter (Signed)
Talked with pt. Will send in a prescription for Xarelto to [facility] in [place]

## 2011-09-14 NOTE — Telephone Encounter (Signed)
New Msg: pt calling stating that she needs new RX's called into [facility] in [place]. Pt doesn't know what new medications she needs called in. Pt said she was told to stop taking Pradaxa for one day and then begin taking new medication. Pt said she hasn't taken pradaxa for 3 days now. Please return pt call to discuss further.

## 2011-09-28 ENCOUNTER — Telehealth: Payer: Self-pay | Admitting: Cardiology

## 2011-09-28 NOTE — Telephone Encounter (Signed)
LMTCB

## 2011-09-28 NOTE — Telephone Encounter (Signed)
Pt wants to change from  xarelto back to pradaxa

## 2011-09-29 NOTE — Telephone Encounter (Signed)
Xarelto does not raise BP.  Needs evaluation of headache by PCP (worst case scenario would be CNS bleed but needs clinical evaluation by PCP).  Would start amlodipine 5 mg daily if needs BP control.

## 2011-09-29 NOTE — Telephone Encounter (Signed)
FU Call: Pt son calling wanting to speak with nurse/MD about pt going back on pradaxa as oppose to xarelto. Pt c/o negative symptoms associated with taking the pradaxa. Please return pt call to discuss further.

## 2011-09-29 NOTE — Telephone Encounter (Signed)
Spoke with patient's son regarding pt needing to change from Xarelto to Pradaxa. Pt's son states he thinks that  Xarelto is making pt  to have increased B/P and headache, he  wants to clear that out by changing her medication. Pt was seen  by her PCP two days ago with C/O of severe headache, her B/P then  was ether 160/90 or 190/60 ? Pt's  PCP gave her a medication to lower her blood  pressure one time only. Her B/P yesterday was 138 systolic. If unable to get pt, son wants to be call at [phone].

## 2011-10-02 ENCOUNTER — Telehealth: Payer: Self-pay | Admitting: Cardiology

## 2011-10-02 NOTE — Telephone Encounter (Signed)
LOV,Echo,12,Doppler faxed to [facility]  @ [phone] 10/02/11/KM

## 2011-10-03 MED ORDER — AMLODIPINE BESYLATE 5 MG PO TABS: 5.0000 mg | ORAL_TABLET | Freq: Every day | ORAL | Status: DC

## 2011-10-03 NOTE — Telephone Encounter (Signed)
Patient's son aware of MD's recommendations. Amlodipine 5 mg one tablet by mouth send to the  [facility] pharmacy for B/P control. Son verbalized understanding.

## 2011-10-03 NOTE — Telephone Encounter (Signed)
Left pt's son a message to call back regarding B/P issues and medication.

## 2012-01-08 ENCOUNTER — Other Ambulatory Visit: Payer: Self-pay | Admitting: Cardiology

## 2012-05-25 ENCOUNTER — Other Ambulatory Visit: Payer: Self-pay | Admitting: Cardiology

## 2012-10-05 ENCOUNTER — Other Ambulatory Visit: Payer: Self-pay | Admitting: Cardiology

## 2012-10-18 ENCOUNTER — Encounter: Payer: Self-pay | Admitting: Cardiology

## 2012-10-22 ENCOUNTER — Telehealth: Payer: Self-pay | Admitting: Cardiology

## 2012-10-22 DIAGNOSIS — I714 Abdominal aortic aneurysm, without rupture: Secondary | ICD-10-CM

## 2012-10-22 NOTE — Telephone Encounter (Signed)
Pt ct on Friday 10-18-12, do have faxed results? pls call

## 2012-10-22 NOTE — Telephone Encounter (Signed)
Spoke with patient. CT results here for Dr Shirlee Latch to review.

## 2012-11-08 ENCOUNTER — Other Ambulatory Visit: Payer: Self-pay | Admitting: *Deleted

## 2012-11-08 NOTE — Telephone Encounter (Signed)
Dr Shirlee Latch reviewed results of CT chest done 10/18/12. He recommended abdominal ultrasound to further evaluate abdominal aorta since it was incompletely imaged on chest CT.I spoke with pt and she is aware of this recommendation. I will ask PCP to call pt to arrange this for her.

## 2012-11-20 ENCOUNTER — Ambulatory Visit: Payer: Medicare Other | Admitting: Cardiology

## 2012-11-29 ENCOUNTER — Encounter (INDEPENDENT_AMBULATORY_CARE_PROVIDER_SITE_OTHER): Payer: Medicare Other

## 2012-11-29 DIAGNOSIS — I714 Abdominal aortic aneurysm, without rupture: Secondary | ICD-10-CM

## 2012-12-03 ENCOUNTER — Ambulatory Visit: Payer: Medicare Other | Admitting: Cardiology

## 2012-12-04 ENCOUNTER — Telehealth: Payer: Self-pay | Admitting: Cardiology

## 2012-12-06 ENCOUNTER — Other Ambulatory Visit: Payer: Self-pay | Admitting: Cardiology

## 2013-01-01 ENCOUNTER — Ambulatory Visit (INDEPENDENT_AMBULATORY_CARE_PROVIDER_SITE_OTHER): Payer: Medicare Other | Admitting: Cardiology

## 2013-01-01 ENCOUNTER — Encounter: Payer: Self-pay | Admitting: Cardiology

## 2013-01-01 VITALS — BP 150/88 | HR 67 | Ht 66.0 in | Wt 161.0 lb

## 2013-01-01 DIAGNOSIS — R5381 Other malaise: Secondary | ICD-10-CM

## 2013-01-01 DIAGNOSIS — R5383 Other fatigue: Secondary | ICD-10-CM | POA: Insufficient documentation

## 2013-01-01 DIAGNOSIS — R0989 Other specified symptoms and signs involving the circulatory and respiratory systems: Secondary | ICD-10-CM

## 2013-01-01 DIAGNOSIS — I714 Abdominal aortic aneurysm, without rupture: Secondary | ICD-10-CM

## 2013-01-01 DIAGNOSIS — R0600 Dyspnea, unspecified: Secondary | ICD-10-CM

## 2013-01-01 DIAGNOSIS — I4891 Unspecified atrial fibrillation: Secondary | ICD-10-CM

## 2013-01-01 DIAGNOSIS — J984 Other disorders of lung: Secondary | ICD-10-CM

## 2013-01-01 LAB — LIPID PANEL
Cholesterol: 223 mg/dL — ABNORMAL HIGH (ref 0–200)
HDL: 61.6 mg/dL (ref >39.00)
Total CHOL/HDL Ratio: 4
Triglycerides: 127 mg/dL (ref 0.0–149.0)
VLDL: 25.4 mg/dL (ref 0.0–40.0)

## 2013-01-01 LAB — BASIC METABOLIC PANEL
Chloride: 105 mEq/L (ref 96–112)
GFR: 44.59 mL/min — ABNORMAL LOW (ref >60.00)
Potassium: 4.7 mEq/L (ref 3.5–5.1)
Sodium: 139 mEq/L (ref 135–145)

## 2013-01-01 LAB — LDL CHOLESTEROL, DIRECT: Direct LDL: 137.5 mg/dL

## 2013-01-01 LAB — CBC WITH DIFFERENTIAL/PLATELET
Basophils Absolute: 0 10*3/uL (ref 0.0–0.1)
Eosinophils Absolute: 0.2 10*3/uL (ref 0.0–0.7)
Lymphocytes Relative: 20.8 % (ref 12.0–46.0)
MCHC: 33 g/dL (ref 30.0–36.0)
Monocytes Relative: 7.4 % (ref 3.0–12.0)
Neutrophils Relative %: 68.8 % (ref 43.0–77.0)
RBC: 4.12 Mil/uL (ref 3.87–5.11)
RDW: 14.6 % (ref 11.5–14.6)

## 2013-01-01 MED ORDER — AMLODIPINE BESYLATE 10 MG PO TABS: 10.0000 mg | ORAL_TABLET | Freq: Every day | ORAL | Status: DC

## 2013-01-01 NOTE — Patient Instructions (Addendum)
LABS TODAY:  Cbc, bmet, bnp, tsh, lipids  Your physician wants you to follow-up in: 6 months with Dr. Shirlee Latch.  You will receive a reminder letter in the mail two months in advance. If you don't receive a letter, please call our office to schedule the follow-up appointment.  Increase Amlodipine to 10mg  daily.  Start taking only half of your current dose of Toprol.  Call Anne back tomorrow with your dose.  [phone]

## 2013-01-01 NOTE — Progress Notes (Signed)
Patient ID: Debbie Parrish, female   DOB: [birth year], 79 y.o.   MRN: [id number] PCP: Dr. Mora Appl in [place]  79 yo with history of rheumatoid arthritis presents for followup of paroxysmal atrial fibrillation. Patient went to [facility] in 11/11 with symptomatic atrial fibrillation with rapid ventricular resopnse. She initially received IV diltiazem and became hypotensive, requiring pressors. She was transferred to [facility]. At [facility], she converted back to NSR. Echo showed EF 50-55% with no wall motion abnormalities and elevated PA pressure consistent with diastolic CHF. She is now on Xarelto. She remains in NSR and has had no tachypalpitations. Repeat echo in 3/13 showed EF 60-65% with moderate LVH.  He husband passed away earlier this year from leukemia.   Main complaint today is fatigue.  Patient tires very easily.  This seems to date at least to the onset of atrial fibrillation.  She has stable exertional dyspnea after walking about a block or climbing a flight stairs.  No orthopnea or PND.  No chest pain.  BP tends to run high.    Labs (11/11): K 3.4, creatinine 1, TSH normal, cardiac enzymes negative, LDL 73, HDL 37  Labs (9/12): K 4.4, creatinine 1.2 Labs (2/13): K 5.2, creatinine 1.1, BNP 257  ECG: NSR, nonspecific T wave flattening.   Allergies (verified):  1) ! Sulfa   Past Medical History:  1. Paroxysmal atrial fibrillation with rapid ventricular response. CHADSVASC score = 3 so needs anticoagulation. Echo (11/11): Mild LV hypertrophy, EF 50-55% without wall motion abnormalities, mild MR, PA systolic pressure 55 mmHg (moderately elevated).  2. Rheumatoid arthritis  3. Lung nodules on chest CT (11/11).  CT chest (9/14) with 2 small right lung nodules.  4. Hypothyroidism  5. h/o cholecystectomy  6. h/o hysterectomy  7. AAA: 3.4 cm on abdominal CT in 11/11, 3.0 cm on ultrasound in 8/12, 3.8 cm on UA in 4/14.  8. Lumbar compression fracture  9. Depression  Family History:   No premature CAD, no atrial fibrillation   Social History:  The patient lives in [place] alone.  She is a widow.  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 She quit in 1989.  No alcohol or drug use  ROS: All systems reviewed and negative except as per HPI.   Current Outpatient Prescriptions  Medication Sig Dispense Refill  . amLODipine (NORVASC) 10 MG tablet Take 1 tablet (10 mg total) by mouth daily.  90 tablet  3  . Calcium Carbonate (CALCIUM 500 PO) Take 1 tablet by mouth daily.        . cetirizine (ZYRTEC) 10 MG tablet Take 10 mg by mouth as needed.        . leflunomide (ARAVA) 10 MG tablet 1 tablet a day      . levothyroxine (SYNTHROID, LEVOTHROID) 125 MCG tablet Take 125 mcg by mouth daily.        . metoprolol (TOPROL-XL) 50 MG 24 hr tablet Take 50 mg by mouth daily.        . predniSONE (DELTASONE) 10 MG tablet Take 2.5 mg by mouth daily. As directed      . Vitamin D, Ergocalciferol, (DRISDOL) 50000 UNITS CAPS Take 50,000 Units by mouth.      . vitamin E 400 UNIT capsule Take 400 Units by mouth daily.        [name] 15 MG TABS tablet TAKE ONE TABLET BY MOUTH EVERY DAY  30 tablet  1  No current facility-administered medications for this visit.    BP 150/88  Pulse 67  Ht 5\' 6"  (1.676 m)  Wt 161 lb (73.029 kg)  BMI 26 kg/m2 General: NAD Neck: No JVD, no thyromegaly or thyroid nodule.  Lungs: Clear to auscultation bilaterally with normal respiratory effort. CV: Nondisplaced PMI.  Heart regular S1/S2, no S3/S4, no murmur.  No peripheral edema.  No carotid bruit.  Normal pedal pulses.  Abdomen: Soft, nontender, no hepatosplenomegaly, no distention.   Neurologic: Alert and oriented x 3.  Psych: depressed affect. Extremities: No clubbing or cyanosis.   Assessment/Plan: 1. Atrial fibrillation: Paroxysmal.  She remains in NSR with minimal tachypalpitations currently.  Continue Xarelto at 15 mg daily (lower dose based on GFR).  I am  going to cut her Toprol XL in half to see if this helps symptoms of fatigue.  2. Fatigue: Profound.  Has been present since around the time atrial fibrillation was first diagnosed. She does not appear to be volume overloaded on exam.  Fatigue could be med related: I will cut Toprol XL dose in half.  I will also get CBC and TSH.  Symptoms could be related to depression as well.  3. Pulmonary nodule: Need rrepeat CT in 9/14.  4. AAA: Will repeat abdominal US in 4/15 5. HTN: Plan to cut back on Toprol XL as above.  As BP has been running high, I will increase amlodipine to 10 mg daily.  [name] 01/01/2013

## 2013-06-19 ENCOUNTER — Ambulatory Visit: Payer: Medicare Other | Admitting: Cardiology

## 2013-08-12 ENCOUNTER — Ambulatory Visit: Payer: Medicare Other | Admitting: Cardiology

## 2013-09-01 ENCOUNTER — Ambulatory Visit: Payer: Medicare Other | Admitting: Cardiology

## 2013-09-24 ENCOUNTER — Ambulatory Visit: Payer: Medicare Other | Admitting: Cardiology

## 2014-05-29 ENCOUNTER — Encounter: Payer: Self-pay | Admitting: *Deleted

## 2014-06-01 ENCOUNTER — Encounter: Payer: Self-pay | Admitting: *Deleted

## 2014-06-01 ENCOUNTER — Ambulatory Visit (INDEPENDENT_AMBULATORY_CARE_PROVIDER_SITE_OTHER): Payer: Medicare Other | Admitting: Cardiology

## 2014-06-01 ENCOUNTER — Encounter: Payer: Self-pay | Admitting: Cardiology

## 2014-06-01 VITALS — BP 130/88 | HR 63 | Ht 66.0 in | Wt 153.0 lb

## 2014-06-01 DIAGNOSIS — I480 Paroxysmal atrial fibrillation: Secondary | ICD-10-CM

## 2014-06-01 DIAGNOSIS — I714 Abdominal aortic aneurysm, without rupture, unspecified: Secondary | ICD-10-CM

## 2014-06-01 DIAGNOSIS — R911 Solitary pulmonary nodule: Secondary | ICD-10-CM

## 2014-06-01 DIAGNOSIS — R0602 Shortness of breath: Secondary | ICD-10-CM

## 2014-06-01 DIAGNOSIS — I10 Essential (primary) hypertension: Secondary | ICD-10-CM

## 2014-06-01 DIAGNOSIS — R0609 Other forms of dyspnea: Secondary | ICD-10-CM

## 2014-06-01 DIAGNOSIS — E039 Hypothyroidism, unspecified: Secondary | ICD-10-CM

## 2014-06-01 NOTE — Progress Notes (Signed)
Patient ID: Debbie Parrish, female   DOB: March 06, 1927, 80 y.o.   MRN: [id number] PCP: Dr. Mora Appl in [place]  80 yo with history of rheumatoid arthritis presents for followup of paroxysmal atrial fibrillation. Patient went to [facility] in 11/11 with symptomatic atrial fibrillation with rapid ventricular resopnse. She initially received IV diltiazem and became hypotensive, requiring pressors. She was transferred to [facility]. At [facility], she converted back to NSR. Echo showed EF 50-55% with no wall motion abnormalities and elevated PA pressure consistent with diastolic CHF. She is now on Xarelto. She remains in NSR and has had no tachypalpitations. Repeat echo in 3/13 showed EF 60-65% with moderate LVH.  She stopped Toprol XL due to fatigue and is not on a rate-controlling medication now.   Main complaint recently has been exertional dyspnea.  She is now short of breath after walking about 50 yards, walking up an incline, or walking up a flight of steps.  This has been going on for 1-2 years but has been progressively worse.  No chest pain.  No orthopnea or PND.  Weight has not gone up appreciably.  She is mildly lightheaded with standing occasionally.  No falls.  +Low back pain.  Son reports some mild short-term memory difficulty.    Labs (11/11): K 3.4, creatinine 1, TSH normal, cardiac enzymes negative, LDL 73, HDL 37  Labs (9/12): K 4.4, creatinine 1.2 Labs (2/13): K 5.2, creatinine 1.1, BNP 257  ECG: NSR, septal Qs.   Allergies (verified):  1) ! Sulfa   Past Medical History:  1. Paroxysmal atrial fibrillation with rapid ventricular response. CHADSVASC score = 3 so needs anticoagulation. Echo (11/11): Mild LV hypertrophy, EF 50-55% without wall motion abnormalities, mild MR, PA systolic pressure 55 mmHg (moderately elevated).  2. Rheumatoid arthritis  3. Lung nodules on chest CT (11/11).  CT chest (9/14) with 2 small right lung nodules.  4. Hypothyroidism  5. h/o cholecystectomy  6.  h/o hysterectomy  7. AAA: 3.4 cm on abdominal CT in 11/11, 3.0 cm on ultrasound in 8/12, 3.8 cm on [place] in 4/14.  8. Lumbar compression fracture  9. Depression  Family History:  No premature CAD, no atrial fibrillation   Social History:  The patient lives in [place] alone (son is in town).  She is a widow.  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 She quit in 1989.  No alcohol or drug use  ROS: All systems reviewed and negative except as per HPI.   Current Outpatient Prescriptions  Medication Sig Dispense Refill  . amLODipine (NORVASC) 10 MG tablet Take 1 tablet (10 mg total) by mouth daily.  90 tablet  3  . leflunomide (ARAVA) 10 MG tablet 1 tablet a day      . levothyroxine (SYNTHROID, LEVOTHROID) 125 MCG tablet Take 125 mcg by mouth daily.        . predniSONE (DELTASONE) 10 MG tablet Take 2.5 mg by mouth daily. As directed      . Rivaroxaban (XARELTO) 15 MG TABS tablet       . XARELTO 15 MG TABS tablet TAKE ONE TABLET BY MOUTH EVERY DAY  30 tablet  1  . mupirocin ointment (BACTROBAN) 2 %        No current facility-administered medications for this visit.    BP 130/88  Pulse 63  Ht 5\' 6"  (1.676 m)  Wt 153 lb (69.4 kg)  BMI 24.71 kg/m2 General: NAD Neck: No  JVD, no thyromegaly or thyroid nodule.  Lungs: Clear to auscultation bilaterally with normal respiratory effort. CV: Nondisplaced PMI.  Heart regular S1/S2,+S4, no murmur.  No peripheral edema.  No carotid bruit.  Normal pedal pulses.  Abdomen: Soft, nontender, no hepatosplenomegaly, no distention.   Neurologic: Alert and oriented x 3.  Psych: depressed affect. Extremities: No clubbing or cyanosis.   Assessment/Plan: 1. Atrial fibrillation: Paroxysmal.  She remains in NSR with no tachypalpitations.  Continue Xarelto at 15 mg daily (lower dose based on GFR).  She is off Toprol XL due to fatigue. 2. Exertional dyspnea: Progressive over the last 1-2 years.  She does not appear  volume overloaded on exam.  Lungs are clear.   - Echo to assess LV/RV function and valvular function.  - Check BNP, CBC, TSH - I will arrange for PFTs.  She is a prior smoker, rule out COPD.  - CT chest to followup nodules but can make sure no interstitial lung disease, etc.  - If these initial tests are normal, would consider ETT-Cardiolite.  3. Pulmonary nodule: Will get noncontrast chest CT to follow.   4. AAA: Will get repeat abdominal US to follow AAA.  5. HTN: Continue amlodipine at current dose. She would like to cut back on it.  I will have her check BP daily and we will call in 2 wks to see what it is running.   [name] 06/01/2014

## 2014-06-01 NOTE — Patient Instructions (Signed)
Your physician recommends that you have  lab work today--BMET/BNP/TSH/CBCd.  Your physician has requested that you have an echocardiogram. Echocardiography is a painless test that uses sound waves to create images of your heart. It provides your doctor with information about the size and shape of your heart and how well your heart's chambers and valves are working. This procedure takes approximately one hour. There are no restrictions for this procedure.  Your physician has recommended that you have a pulmonary function test. Pulmonary Function Tests are a group of tests that measure how well air moves in and out of your lungs.  Schedule an appointment for a non contrast chest CT.  Your physician has requested that you have an abdominal aorta duplex. During this test, an ultrasound is used to evaluate the aorta. Allow 30 minutes for this exam. Do not eat after midnight the day before and avoid carbonated beverages.  Your physician recommends that you schedule a follow-up appointment in: 4-6 weeks with Dr Shirlee Latch.

## 2014-06-02 ENCOUNTER — Other Ambulatory Visit: Payer: Self-pay | Admitting: *Deleted

## 2014-06-02 DIAGNOSIS — I714 Abdominal aortic aneurysm, without rupture, unspecified: Secondary | ICD-10-CM

## 2014-06-02 LAB — CBC WITH DIFFERENTIAL/PLATELET
BASOS PCT: 0.3 % (ref 0.0–3.0)
Basophils Absolute: 0 10*3/uL (ref 0.0–0.1)
EOS ABS: 0.2 10*3/uL (ref 0.0–0.7)
Eosinophils Relative: 2.9 % (ref 0.0–5.0)
HCT: 40.5 % (ref 36.0–46.0)
HEMOGLOBIN: 13.2 g/dL (ref 12.0–15.0)
Lymphocytes Relative: 24.5 % (ref 12.0–46.0)
Lymphs Abs: 1.5 10*3/uL (ref 0.7–4.0)
MCHC: 32.7 g/dL (ref 30.0–36.0)
MCV: 98.2 fl (ref 78.0–100.0)
MONO ABS: 0.4 10*3/uL (ref 0.1–1.0)
Monocytes Relative: 5.9 % (ref 3.0–12.0)
NEUTROS ABS: 4 10*3/uL (ref 1.4–7.7)
Neutrophils Relative %: 66.4 % (ref 43.0–77.0)
Platelets: 222 10*3/uL (ref 150.0–400.0)
RBC: 4.13 Mil/uL (ref 3.87–5.11)
RDW: 14.4 % (ref 11.5–15.5)
WBC: 6 10*3/uL (ref 4.0–10.5)

## 2014-06-02 LAB — BASIC METABOLIC PANEL
BUN: 25 mg/dL — AB (ref 6–23)
CHLORIDE: 104 meq/L (ref 96–112)
CO2: 25 meq/L (ref 19–32)
Calcium: 9.3 mg/dL (ref 8.4–10.5)
Creatinine, Ser: 1.3 mg/dL — ABNORMAL HIGH (ref 0.4–1.2)
GFR: 42.04 mL/min — ABNORMAL LOW (ref >60.00)
GLUCOSE: 90 mg/dL (ref 70–99)
POTASSIUM: 4.5 meq/L (ref 3.5–5.1)
Sodium: 138 mEq/L (ref 135–145)

## 2014-06-02 LAB — TSH: TSH: 6.19 u[IU]/mL — ABNORMAL HIGH (ref 0.35–4.50)

## 2014-06-02 LAB — BRAIN NATRIURETIC PEPTIDE: Pro B Natriuretic peptide (BNP): 74 pg/mL (ref 0.0–100.0)

## 2014-06-22 ENCOUNTER — Ambulatory Visit (HOSPITAL_COMMUNITY): Payer: Medicare Other | Attending: Cardiology | Admitting: Cardiology

## 2014-06-22 ENCOUNTER — Ambulatory Visit (INDEPENDENT_AMBULATORY_CARE_PROVIDER_SITE_OTHER)
Admission: RE | Admit: 2014-06-22 | Discharge: 2014-06-22 | Disposition: A | Discharge status: Home / Self Care | Payer: Medicare Other | Source: Ambulatory Visit | Attending: Cardiology | Admitting: Cardiology

## 2014-06-22 ENCOUNTER — Ambulatory Visit (HOSPITAL_BASED_OUTPATIENT_CLINIC_OR_DEPARTMENT_OTHER): Payer: Medicare Other | Admitting: Radiology

## 2014-06-22 ENCOUNTER — Encounter (HOSPITAL_COMMUNITY): Payer: Medicare Other

## 2014-06-22 DIAGNOSIS — E785 Hyperlipidemia, unspecified: Secondary | ICD-10-CM | POA: Insufficient documentation

## 2014-06-22 DIAGNOSIS — Z87891 Personal history of nicotine dependence: Secondary | ICD-10-CM | POA: Diagnosis not present

## 2014-06-22 DIAGNOSIS — I714 Abdominal aortic aneurysm, without rupture, unspecified: Secondary | ICD-10-CM

## 2014-06-22 DIAGNOSIS — R0602 Shortness of breath: Secondary | ICD-10-CM

## 2014-06-22 DIAGNOSIS — I10 Essential (primary) hypertension: Secondary | ICD-10-CM | POA: Insufficient documentation

## 2014-06-22 DIAGNOSIS — I480 Paroxysmal atrial fibrillation: Secondary | ICD-10-CM

## 2014-06-22 NOTE — Progress Notes (Signed)
Echocardiogram performed.  

## 2014-06-22 NOTE — Progress Notes (Signed)
Duplex aorta performed 

## 2014-06-23 ENCOUNTER — Other Ambulatory Visit: Payer: Self-pay

## 2014-06-23 DIAGNOSIS — R0609 Other forms of dyspnea: Principal | ICD-10-CM

## 2014-06-24 ENCOUNTER — Encounter: Payer: Self-pay | Admitting: *Deleted

## 2014-06-24 ENCOUNTER — Other Ambulatory Visit: Payer: Self-pay | Admitting: *Deleted

## 2014-06-24 DIAGNOSIS — R911 Solitary pulmonary nodule: Secondary | ICD-10-CM

## 2014-06-25 ENCOUNTER — Telehealth: Payer: Self-pay | Admitting: *Deleted

## 2014-06-25 NOTE — Telephone Encounter (Signed)
,  Debbie Parrish said she is going to have her PFT done in [place]

## 2014-06-30 ENCOUNTER — Other Ambulatory Visit: Payer: Medicare Other

## 2014-07-06 ENCOUNTER — Ambulatory Visit (HOSPITAL_COMMUNITY): Payer: Medicare Other | Attending: Cardiovascular Disease | Admitting: Radiology

## 2014-07-06 DIAGNOSIS — R0602 Shortness of breath: Secondary | ICD-10-CM | POA: Diagnosis not present

## 2014-07-06 DIAGNOSIS — R0609 Other forms of dyspnea: Secondary | ICD-10-CM | POA: Insufficient documentation

## 2014-07-06 DIAGNOSIS — I4891 Unspecified atrial fibrillation: Secondary | ICD-10-CM | POA: Diagnosis not present

## 2014-07-06 DIAGNOSIS — I480 Paroxysmal atrial fibrillation: Secondary | ICD-10-CM

## 2014-07-06 MED ORDER — TECHNETIUM TC 99M SESTAMIBI GENERIC - CARDIOLITE
10.0000 | Freq: Once | INTRAVENOUS | Status: AC | PRN
  Administered 2014-07-06: 10 via INTRAVENOUS

## 2014-07-06 MED ORDER — TECHNETIUM TC 99M SESTAMIBI GENERIC - CARDIOLITE
30.0000 | Freq: Once | INTRAVENOUS | Status: AC | PRN
  Administered 2014-07-06: 30 via INTRAVENOUS

## 2014-07-06 NOTE — Progress Notes (Addendum)
[facility] SITE 3 NUCLEAR MED [address] [place], [place] [address] [phone]    Cardiology Nuclear Med Study  Debbie Parrish is a 80 y.o. female     MRN : [id number]     DOB: 04/22/1927  Procedure Date: 07/06/2014  Nuclear Med Background Indication for Stress Test:  Evaluation for Ischemia History:  No known CAD, Afib Cardiac Risk Factors: History of Smoking  Symptoms:  DOE and SOB   Nuclear Pre-Procedure Caffeine/Decaff Intake:  None> 12 hrs NPO After: 8:00pm   Lungs:  clear O2 Sat: 93% on room air. IV 0.9% NS with Angio Cath:  24g  IV Site: R Forearm x 1, tolerated well  IV Started by:  Irean Hong, RN  Chest Size (in):  34 Cup Size: A  Height: 5\' 6"  (1.676 m)  Weight:  150 lb (68.04 kg)  BMI:  Body mass index is 24.22 kg/(m^2). Tech Comments:  Norvasc last night. , RN.    Nuclear Med Study 1 or 2 day study: 1 day  Stress Test Type:  Stress  Reading MD: N/A  Order Authorizing Provider:  Irean Hong, MD  Resting Radionuclide: Technetium 45m Sestamibi  Resting Radionuclide Dose: 11.0 mCi   Stress Radionuclide:  Technetium 28m Sestamibi  Stress Radionuclide Dose: 33.0 mCi           Stress Protocol Rest HR: 64 Stress HR: 123  Rest BP: 145/84 Stress BP: 187/84  Exercise Time (min): 4:30 METS: 4.6   Predicted Max HR: 135 bpm % Max HR: 91.11 bpm Rate Pressure Product: 84m   Dose of Adenosine (mg):  n/a Dose of Lexiscan: n/a mg  Dose of Atropine (mg): n/a Dose of Dobutamine: n/a mcg/kg/min (at max HR)  Stress Test Technologist: [id number], BS-ES  Nuclear Technologist:  Nelson Chimes, CNMT     Rest Procedure:  Myocardial perfusion imaging was performed at rest 45 minutes following the intravenous administration of Technetium 105m Sestamibi. Rest ECG: NSR with non-specific ST-T wave changes  Stress Procedure:  The patient exercised on the treadmill utilizing the Bruce Protocol for 4:30 minutes. The patient stopped due to leg  fatigue and denied any chest pain.  Technetium 75m Sestamibi was injected at peak exercise and myocardial perfusion imaging was performed after a brief delay. Stress ECG: No significant change from baseline ECG  QPS Raw Data Images:  Normal; no motion artifact; normal heart/lung ratio. Stress Images:  Normal homogeneous uptake in all areas of the myocardium. Rest Images:  Normal homogeneous uptake in all areas of the myocardium. Subtraction (SDS):  No evidence of ischemia. Transient Ischemic Dilatation (Normal <1.22):  0.91 Lung/Heart Ratio (Normal <0.45):  0.27  Quantitative Gated Spect Images QGS EDV:  63 ml QGS ESV:  18 ml  Impression Exercise Capacity:  Fair exercise capacity BP Response:  Normal blood pressure response. Clinical Symptoms:  No significant symptoms noted. ECG Impression:  No significant ST segment change suggestive of ischemia. Comparison with Prior Nuclear Study: No images to compare  Overall Impression:  Normal stress nuclear study.  No evidence of ischemia.   LV Ejection Fraction: 71%.  LV Wall Motion:  NL LV Function; NL Wall Motion   84m, Vesta Mixer., MD, [facility] 07/06/2014, 2:19 PM [address],  [address] Office [phone] Pager [phone]  Normal study, please report to patient.   [phone] 07/07/2014 11:09 AM

## 2014-07-07 NOTE — Progress Notes (Signed)
Notified of stress test results. 

## 2014-07-10 ENCOUNTER — Ambulatory Visit (INDEPENDENT_AMBULATORY_CARE_PROVIDER_SITE_OTHER): Payer: Medicare Other | Admitting: Cardiology

## 2014-07-10 ENCOUNTER — Encounter: Payer: Self-pay | Admitting: Cardiology

## 2014-07-10 VITALS — BP 130/70 | HR 65 | Ht 66.0 in | Wt 154.1 lb

## 2014-07-10 DIAGNOSIS — I714 Abdominal aortic aneurysm, without rupture, unspecified: Secondary | ICD-10-CM

## 2014-07-10 DIAGNOSIS — R911 Solitary pulmonary nodule: Secondary | ICD-10-CM

## 2014-07-10 DIAGNOSIS — E785 Hyperlipidemia, unspecified: Secondary | ICD-10-CM

## 2014-07-10 DIAGNOSIS — I480 Paroxysmal atrial fibrillation: Secondary | ICD-10-CM

## 2014-07-10 DIAGNOSIS — R0609 Other forms of dyspnea: Secondary | ICD-10-CM

## 2014-07-10 MED ORDER — TIOTROPIUM BROMIDE MONOHYDRATE 18 MCG IN CAPS: 18.0000 ug | ORAL_CAPSULE | Freq: Every day | RESPIRATORY_TRACT | Status: DC

## 2014-07-10 MED ORDER — ATORVASTATIN CALCIUM 20 MG PO TABS: 20.0000 mg | ORAL_TABLET | Freq: Every day | ORAL | Status: DC

## 2014-07-10 NOTE — Patient Instructions (Signed)
Start atorvastatin 20mg  daily.   Use Spiriva Inhaler daily.  Your physician recommends that you return for a FASTING lipid profile /liver profile in 2 months.--I have given you an order for this. Please fax the results to Dr [phone].  Your physician recommends that you schedule a follow-up appointment in: 4 months with Dr [phone].

## 2014-07-12 NOTE — Progress Notes (Signed)
Patient ID: Debbie Parrish, female   DOB: 05-15-1927, 80 y.o.   MRN: [id number] PCP: Dr. Mora Appl in [place]  80 yo with history of rheumatoid arthritis presents for followup of paroxysmal atrial fibrillation. Patient went to [facility] in 11/11 with symptomatic atrial fibrillation with rapid ventricular resopnse. She initially received IV diltiazem and became hypotensive, requiring pressors. She was transferred to [facility]. At [facility], she converted back to NSR. Echo showed EF 50-55% with no wall motion abnormalities and elevated PA pressure consistent with diastolic CHF. She is now on Xarelto. She remains in NSR and has had no tachypalpitations.  She stopped Toprol XL due to fatigue and is not on a rate-controlling medication now.   Main complaint recently has been exertional dyspnea.  She is now short of breath after walking about 50 yards, walking up an incline, or walking up a flight of steps.  This has been going on for 1-2 years but has been progressively worse.  No chest pain.  No orthopnea or PND.  Weight has not gone up appreciably.  She is mildly lightheaded with standing occasionally.  No falls.  +Low back pain.  Son reports some mild short-term memory difficulty.   Given the significance of the exertional dyspnea, I had her get a repeat echo in 11/15.  This showed EF 55-60%, mild MR with MVP, normal RV.  CT chest w/o contrast done to follow lung nodules showed no interstitial lung disease but there was evidence for emphysema.  PFTs done in [place], however, did not show significant obstruction.  ETT-Cardiolite showed no ischemia or infarction.    Labs (11/11): K 3.4, creatinine 1, TSH normal, cardiac enzymes negative, LDL 73, HDL 37  Labs (9/12): K 4.4, creatinine 1.2 Labs (2/13): K 5.2, creatinine 1.1, BNP 257 Labs (10/15) with K 4.5, creatinine 1.3, BNP 74  ECG: NSR, normal.    Allergies (verified):  1) ! Sulfa   Past Medical History:  1. Paroxysmal atrial fibrillation with  rapid ventricular response. CHADSVASC score = 3 so needs anticoagulation. Echo (11/11): Mild LV hypertrophy, EF 50-55% without wall motion abnormalities, mild MR, PA systolic pressure 55 mmHg (moderately elevated).  Echo (11/15) with EF 55-60%, mild LVH, mild MR with mild MV prolapse, normal RV size and systolic function, PA systolic pressure 28 mmHg.  2. Rheumatoid arthritis  3. Lung nodules on chest CT (11/11).  CT chest (9/14) with 2 small right lung nodules.  4. Hypothyroidism  5. h/o cholecystectomy  6. h/o hysterectomy  7. AAA: 3.4 cm on abdominal CT in 11/11, 3.0 cm on ultrasound in 8/12, 3.8 cm on [place] in 4/14, 3.1 cm on [place] in 11/15.  8. Lumbar compression fracture  9. Depression 9. COPD:  CT without contrast (11/15) showed emphysema, coronary calcification, RLL 6 cm nodule. PFTs (11/15) showed mild restriction but not significant obstruction.  10. CAD: Coronary calcification on chest CT 11/15.  ETT-Cardiolite (11/15) showed 4:30 exercise, EF 71%, no ischemia or infarction.   Family History:  No premature CAD, no atrial fibrillation   Social History:  The patient lives in [place] alone (son is in town).  She is a widow.  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 She quit in 1989.  No alcohol or drug use  ROS: All systems reviewed and negative except as per HPI.   Current Outpatient Prescriptions  Medication Sig Dispense Refill  . amLODipine (NORVASC) 10 MG tablet Take 1 tablet (10 mg  total) by mouth daily. 90 tablet 3  . CALCIUM PO 1000 mg 3 tab by mouth daily.    Marland Kitchen leflunomide (ARAVA) 10 MG tablet 1 tablet a day    . levothyroxine (SYNTHROID, LEVOTHROID) 125 MCG tablet Take 125 mcg by mouth daily.      . mupirocin ointment (BACTROBAN) 2 %     . predniSONE (DELTASONE) 10 MG tablet Take 2.5 mg by mouth daily. As directed    . XARELTO 15 MG TABS tablet TAKE ONE TABLET BY MOUTH EVERY DAY 30 tablet 1  . atorvastatin (LIPITOR) 20 MG tablet  Take 1 tablet (20 mg total) by mouth daily. 30 tablet 3  . levothyroxine (SYNTHROID, LEVOTHROID) 100 MCG tablet     . tiotropium (SPIRIVA HANDIHALER) 18 MCG inhalation capsule Place 1 capsule (18 mcg total) into inhaler and inhale daily. 30 capsule 4   No current facility-administered medications for this visit.    BP 130/70 mmHg  Pulse 65  Ht 5\' 6"  (1.676 m)  Wt 154 lb 1.9 oz (69.908 kg)  BMI 24.89 kg/m2  SpO2 97% General: NAD Neck: No JVD, no thyromegaly or thyroid nodule.  Lungs: Mild rhonchi bilaterally CV: Nondisplaced PMI.  Heart regular S1/S2,+S4, no murmur.  No peripheral edema.  No carotid bruit.  Normal pedal pulses.  Abdomen: Soft, nontender, no hepatosplenomegaly, no distention.   Neurologic: Alert and oriented x 3.  Psych: depressed affect. Extremities: No clubbing or cyanosis.   Assessment/Plan: 1. Atrial fibrillation: Paroxysmal.  She remains in NSR with no tachypalpitations.  Continue Xarelto at 15 mg daily (lower dose based on GFR).  She is off Toprol XL due to fatigue. 2. Exertional dyspnea: Progressive over the last 1-2 years.  She does not appear volume overloaded on exam, BNP was not elevated.  Lungs with occasional rhonchi.  Echo showed normal EF, mild MR, normal RV function.  ETT-Cardiolite without ischemia or infarction.  Chest CT showed no interstitial lung disease but did show emphysema.  Interestingly, PFTs in [place] did not show a significant obstructive defect.  Overall, I think that COPD may be the main cause of her dyspnea.     - Will have her try Spiriva to see if this leads to improvement in symptoms.   3. Pulmonary nodules: Repeat CT chest in 7/16.   4. AAA: Repeat abdominal 8/16 in 11/16.   5. HTN: Continue current amlodipine.  6. CAD: Coronary calcification on CT.  I think it would be reasonable for her to take a statin.  I will start her on atorvastatin 20 mg daily with lipids/LFTs in 2 months.  She will not take ASA as she is already on Xarelto.     12/16 07/12/2014

## 2014-11-20 ENCOUNTER — Other Ambulatory Visit: Payer: Self-pay | Admitting: Cardiology

## 2014-12-31 ENCOUNTER — Other Ambulatory Visit: Payer: Self-pay | Admitting: Cardiology

## 2015-01-01 NOTE — Telephone Encounter (Signed)
Per note 12.4.15 

## 2015-02-15 ENCOUNTER — Inpatient Hospital Stay: Admission: RE | Admit: 2015-02-15 | Payer: Medicare Other | Source: Ambulatory Visit

## 2015-02-17 ENCOUNTER — Ambulatory Visit (INDEPENDENT_AMBULATORY_CARE_PROVIDER_SITE_OTHER)
Admission: RE | Admit: 2015-02-17 | Discharge: 2015-02-17 | Disposition: A | Discharge status: Home / Self Care | Payer: Medicare Other | Source: Ambulatory Visit | Attending: Cardiology | Admitting: Cardiology

## 2015-02-17 DIAGNOSIS — R911 Solitary pulmonary nodule: Secondary | ICD-10-CM

## 2015-02-18 ENCOUNTER — Other Ambulatory Visit: Payer: Self-pay | Admitting: *Deleted

## 2015-02-18 DIAGNOSIS — R911 Solitary pulmonary nodule: Secondary | ICD-10-CM

## 2015-03-03 ENCOUNTER — Other Ambulatory Visit: Payer: Self-pay

## 2015-03-03 MED ORDER — ATORVASTATIN CALCIUM 20 MG PO TABS: 20.0000 mg | ORAL_TABLET | Freq: Every day | ORAL | Status: DC

## 2015-07-07 ENCOUNTER — Encounter: Payer: Self-pay | Admitting: *Deleted

## 2015-07-07 ENCOUNTER — Encounter: Payer: Self-pay | Admitting: Cardiology

## 2015-07-07 ENCOUNTER — Ambulatory Visit (INDEPENDENT_AMBULATORY_CARE_PROVIDER_SITE_OTHER): Payer: Medicare Other | Admitting: Cardiology

## 2015-07-07 VITALS — BP 132/80 | HR 70 | Ht 66.5 in | Wt 129.1 lb

## 2015-07-07 DIAGNOSIS — I480 Paroxysmal atrial fibrillation: Secondary | ICD-10-CM

## 2015-07-07 DIAGNOSIS — I714 Abdominal aortic aneurysm, without rupture, unspecified: Secondary | ICD-10-CM

## 2015-07-07 DIAGNOSIS — R0609 Other forms of dyspnea: Secondary | ICD-10-CM

## 2015-07-07 DIAGNOSIS — R55 Syncope and collapse: Secondary | ICD-10-CM | POA: Diagnosis not present

## 2015-07-07 DIAGNOSIS — J849 Interstitial pulmonary disease, unspecified: Secondary | ICD-10-CM

## 2015-07-07 NOTE — Patient Instructions (Addendum)
     Medication Instructions:  Stop amlodipine  Labwork: None today  Testing/Procedures: Your physician has requested that you have an abdominal aorta duplex. During this test, an ultrasound is used to evaluate the aorta. Allow 30 minutes for this exam. Do not eat after midnight the day before and avoid carbonated beverages  Your physician has recommended that you wear an event monitor. Event monitors are medical devices that record the heart's electrical activity. Doctors most often [place] these monitors to diagnose arrhythmias. Arrhythmias are problems with the speed or rhythm of the heartbeat. The monitor is a small, portable device. You can wear one while you do your normal daily activities. This is usually used to diagnose what is causing palpitations/syncope (passing out). 30 DAY      Follow-Up: Your physician wants you to follow-up in: 6 months with Dr Shirlee Latch. (May 2017). You will receive a reminder letter in the mail two months in advance. If you don't receive a letter, please call our office to schedule the follow-up appointment.   Any Other Special Instructions Will Be Listed Below (If Applicable).  You have been referred to Dr Marchelle Gearing at [facility].      If you need a refill on your cardiac medications before your next appointment, please call your pharmacy.

## 2015-07-08 DIAGNOSIS — R55 Syncope and collapse: Secondary | ICD-10-CM | POA: Insufficient documentation

## 2015-07-08 NOTE — Progress Notes (Signed)
Patient ID: Debbie Parrish, female   DOB: [birth year], 81 y.o.   MRN: [id number] PCP: Dr. McLeod/Dr. Coralee Pesa in [place]  81 yo with history of rheumatoid arthritis and paroxysmal atrial fibrillation presents for followup. Patient went to [facility] in 11/11 with symptomatic atrial fibrillation with rapid ventricular resopnse. She initially received IV diltiazem and became hypotensive, requiring pressors. She was transferred to [facility]. At [facility], she converted back to NSR. Echo showed EF 50-55% with no wall motion abnormalities and elevated PA pressure consistent with diastolic CHF. She is now on Xarelto. She remains in NSR and has had no tachypalpitations.  She stopped Toprol XL due to fatigue and is not on a rate-controlling medication now.   She has chronic exertional dyspnea.  She is now short of breath after walking about 50 yards, walking up an incline, or walking up a flight of steps.  This has been going on for several years but has slowly progressed.  No chest pain.  No orthopnea or PND.  Weight is down 25 lbs compared to last appointment.  She is lightheaded with standing at times.  BP does not run high, she remains on amlodipine 2.5 mg daily.  Given the significance of the exertional dyspnea, I had her get a repeat echo in 11/15.  This showed EF 55-60%, mild MR with MVP, normal RV.  ETT-Cardiolite showed no ischemia or infarction.  More recently, a CT chest done in 7/16 was concerning for possible NSIP.    In 9/16, she fell walking in a friend's driveway and fractured shoulder and hip.  She is not sure what happened and there is concern that she passed out.  Holter x 48 hrs done in [place] showed frequent PVCs/PACs and short (4 beat) runs of NSVT.     Labs (11/11): K 3.4, creatinine 1, TSH normal, cardiac enzymes negative, LDL 73, HDL 37  Labs (9/12): K 4.4, creatinine 1.2 Labs (2/13): K 5.2, creatinine 1.1, BNP 257 Labs (10/15) with K 4.5, creatinine 1.3, BNP 74  ECG: NSR, septal  Qs  Allergies (verified):  1) ! Sulfa   Past Medical History:  1. Paroxysmal atrial fibrillation with rapid ventricular response. CHADSVASC score = 3 so needs anticoagulation. Echo (11/11): Mild LV hypertrophy, EF 50-55% without wall motion abnormalities, mild MR, PA systolic pressure 55 mmHg (moderately elevated).  Echo (11/15) with EF 55-60%, mild LVH, mild MR with mild MV prolapse, normal RV size and systolic function, PA systolic pressure 28 mmHg.  2. Rheumatoid arthritis  3. Lung nodules on chest CT (11/11).  CT chest (9/14) with 2 small right lung nodules. CT chest 7/16 with 6 mm RLL nodule.  4. Hypothyroidism  5. h/o cholecystectomy  6. h/o hysterectomy  7. AAA: 3.4 cm on abdominal CT in 11/11, 3.0 cm on ultrasound in 8/12, 3.8 cm on [place] in 4/14, 3.1 cm on [place] in 11/15.  8. Lumbar compression fracture  9. Depression 9. COPD:  CT without contrast (11/15) showed emphysema, coronary calcification, RLL 6 cm nodule. PFTs (11/15) showed mild restriction but not significant obstruction.  10. CAD: Coronary calcification on chest CT 11/15.  ETT-Cardiolite (11/15) showed 4:30 exercise, EF 71%, no ischemia or infarction.  11. Pyelonephritis 5/16.  12. Interstitial lung disease: CT 7/16 with changes concerning for NSIP.  11/15 PFTs with mild restriction.  13. Holter (2016) with NSR, frequent PVCs, frequent PACs, 4 beat runs NSVT.   Family History:  No premature CAD, no atrial fibrillation   Social History:  The patient lives in [place] alone (son is in town).  She is a widow.  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 She quit in 1989.  No alcohol or drug use  ROS: All systems reviewed and negative except as per HPI.   Current Outpatient Prescriptions  Medication Sig Dispense Refill  . atorvastatin (LIPITOR) 20 MG tablet Take 1 tablet (20 mg total) by mouth daily. 90 tablet 0  . leflunomide (ARAVA) 10 MG tablet 1 tablet a day    . levothyroxine  (SYNTHROID, LEVOTHROID) 100 MCG tablet     . predniSONE (DELTASONE) 10 MG tablet Take 2.5 mg by mouth daily. As directed    . XARELTO 15 MG TABS tablet TAKE ONE TABLET BY MOUTH EVERY DAY 30 tablet 1   No current facility-administered medications for this visit.    BP 132/80 mmHg  Pulse 70  Ht 5' 6.5" (1.689 m)  Wt 129 lb 1.9 oz (58.568 kg)  BMI 20.53 kg/m2 General: NAD Neck: No JVD, no thyromegaly or thyroid nodule.  Lungs: Slight crackles at bases bilaterally.  CV: Nondisplaced PMI.  Heart regular S1/S2,+S4, no murmur.  No peripheral edema.  No carotid bruit.  Normal pedal pulses.  Abdomen: Soft, nontender, no hepatosplenomegaly, no distention.   Neurologic: Alert and oriented x 3.  Psych: depressed affect. Extremities: No clubbing or cyanosis.   Assessment/Plan: 1. Atrial fibrillation: Paroxysmal.  She remains in NSR with no tachypalpitations.  Continue Xarelto at 15 mg daily (lower dose based on GFR).  She is off Toprol XL due to fatigue.  CBC today.  2. Exertional dyspnea: Progressive over the last several years.  She does not appear volume overloaded on exam, BNP was not elevated.  Echo (11/15) showed normal EF, mild MR, normal RV function.  ETT-Cardiolite (11/15) without ischemia or infarction.  Chest CT in 7/16 was concerning for possible interstitial lung disease (NSIP).    - I would like her to have pulmonary evaluation, will refer to Dr Marchelle Gearing.  3. Pulmonary nodules: Repeat CT chest in 7/17.   4. AAA: Repeat abdominal US this month.   5. HTN: BP not elevated, she is having orthostatic symptoms.  Stop amlodipine altogether.  6. CAD: Coronary calcification on CT. She is on atorvastatin.  She will not take ASA as she is already on Xarelto.  7. Fall, ?syncope: 48 hour holter showed PVCs/PACs, and short NSVT.  Nothing to cause syncope.  I recommended a longer period of monitoring, will arrange for 30 day monitor.    [name] 07/08/2015

## 2015-08-03 ENCOUNTER — Encounter: Payer: Self-pay | Admitting: Internal Medicine

## 2015-08-03 ENCOUNTER — Ambulatory Visit (INDEPENDENT_AMBULATORY_CARE_PROVIDER_SITE_OTHER): Payer: Medicare Other | Admitting: Internal Medicine

## 2015-08-03 VITALS — BP 128/88 | HR 72 | Ht 65.5 in | Wt 132.2 lb

## 2015-08-03 DIAGNOSIS — J8489 Other specified interstitial pulmonary diseases: Secondary | ICD-10-CM

## 2015-08-03 DIAGNOSIS — R911 Solitary pulmonary nodule: Secondary | ICD-10-CM | POA: Diagnosis not present

## 2015-08-03 NOTE — Progress Notes (Signed)
Subjective:    Patient ID: Debbie Parrish, female    DOB: Feb 12, 1927,    MRN: [id number]  HPI   53 yowf quit smoking in 1989 dx RA in early 90's prev on mtx (remotely)  referred by dr Elly Modena to pulmonary clinic 08/03/2015 for sob with ? NSIP on CT chest 02/16/14    08/03/2015 1st [facility] office visit/ Argusta Mcgann   Chief Complaint  Patient presents with  . PULMONARY CONSULT    Referred by Dr. [name]. Pt c/o constant fatigue that sometimes worsens without cause and SOB with exertion. Pt denies any history of breathing problems. Pt denies cough/wheeze/CP/tightness.   variable decrease in ex tolerance  X  At least 2 years, slowly progressive until p had hip and shoulder sugery sept 2016 and better activity tol since then    No obvious  patterns in day to day or daytime variabilty or assoc chronic cough or cp or chest tightness, subjective wheeze overt sinus or hb symptoms. No unusual exp hx or h/o childhood pna/ asthma or knowledge of premature birth.  Sleeping ok without nocturnal  or early am exacerbation  of respiratory  c/o's or need for noct saba. Also denies any obvious fluctuation of symptoms with weather or environmental changes or other aggravating or alleviating factors except as outlined above   Current Medications, Allergies, Complete Past Medical History, Past Surgical History, Family History, and Social History were reviewed in [employer] record.             Review of Systems  Constitutional: Positive for fatigue. Negative for fever and unexpected weight change.  HENT: Negative for congestion, dental problem, ear pain, nosebleeds, postnasal drip, rhinorrhea, sinus pressure, sneezing, sore throat and trouble swallowing.   Eyes: Negative.  Negative for redness and itching.  Respiratory: Negative.  Negative for cough, chest tightness, shortness of breath and wheezing.   Cardiovascular: Negative.  Negative for palpitations and leg  swelling.  Gastrointestinal: Negative.  Negative for nausea and vomiting.  Endocrine: Negative.   Genitourinary: Negative.  Negative for dysuria.  Musculoskeletal: Positive for arthralgias. Negative for joint swelling.  Skin: Negative.  Negative for rash.  Allergic/Immunologic: Negative.   Neurological: Positive for headaches.  Hematological: Bruises/bleeds easily.  Psychiatric/Behavioral: Negative.  Negative for dysphoric mood. The patient is not nervous/anxious.        Objective:   Physical Exam amb elderly wf nad   Wt Readings from Last 3 Encounters:  08/03/15 132 lb 3.2 oz (59.966 kg)  07/07/15 129 lb 1.9 oz (58.568 kg)  07/10/14 154 lb 1.9 oz (69.908 kg)    Vital signs reviewed   HEENT: nl dentition, turbinates, and oropharynx. Nl external ear canals without cough reflex   NECK :  without JVD/Nodes/TM/ nl carotid upstrokes bilaterally   LUNGS: no acc muscle use,  Nl contour chest which is clear to A and P bilaterally without cough on insp or exp maneuvers   CV:  RRR  no s3 or murmur or increase in P2, no edema   ABD:  soft and nontender with nl inspiratory excursion in the supine position. No bruits or organomegaly, bowel sounds nl  MS:  Nl gait/ ext warm without deformities, calf tenderness, cyanosis or clubbing No obvious joint restrictions   SKIN: warm and dry without lesions    NEURO:  alert, approp, nl sensorium with  no motor deficits     I personally reviewed images and agree with radiology impression as follows:  CT  Chest   02/17/15 1. 6 mm subpleural right lower lobe nodule is unchanged from 06/22/2014. Additional followup in 1 year is recommended. This recommendation follows the consensus statement: Guidelines for Management of Small Pulmonary Nodules Detected on CT Scans: A Statement from the Fleischner Society as published in Radiology 2005; 237:395-400. 2. Difficult to exclude basilar pulmonary parenchymal changes of nonspecific interstitial  pneumonitis (NSIP).    Labs reviewed:      Chemistry      Component Value Date/Time   NA 138 06/01/2014 1711   K 4.5 06/01/2014 1711   CL 104 06/01/2014 1711   CO2 25 06/01/2014 1711   BUN 25* 06/01/2014 1711   CREATININE 1.3* 06/01/2014 1711      Component Value Date/Time   CALCIUM 9.3 06/01/2014 1711   ALKPHOS 45 06/20/2010 2210   AST 19 06/20/2010 2210   ALT 10 06/20/2010 2210   BILITOT 0.6 06/20/2010 2210        Lab Results  Component Value Date   WBC 6.0 06/01/2014   HGB 13.2 06/01/2014   HCT 40.5 06/01/2014   MCV 98.2 06/01/2014   PLT 222.0 06/01/2014        Lab Results  Component Value Date   TSH 6.19* 06/01/2014     Lab Results  Component Value Date   PROBNP 74.0 06/01/2014       Lab Results  Component Value Date   ESRSEDRATE 43* 06/20/2010           Assessment & Plan:

## 2015-08-03 NOTE — Patient Instructions (Signed)
You have very mild pulmonary fibrosis likely related to your rheumatism and as long as this is managed effectively you should be fine  Keep walking as you are an let me know if you feel you are losing ground with your daily routines

## 2015-08-04 DIAGNOSIS — J8489 Other specified interstitial pulmonary diseases: Secondary | ICD-10-CM | POA: Insufficient documentation

## 2015-08-04 NOTE — Assessment & Plan Note (Signed)
See CT chest 02/17/15  - 08/03/2015  Walked RA x 3 laps @ 185 ft each stopped due to End of study, nl pace, no sob or desat   Her CT scan scan is actually remarkably normal for a patient with a history of long-standing rheumatoid arthritis who was previouly on mtx and  I could not convince her that she's had any progression in the last 6 m, if fact she convinced that her othopedic problems were what was slowing her down and now that she's had a hip replacement her tolerance is improving, which may be the case here  General rule this disease is that as long as the underlying collagen vasc dz is  addressed appropriately and potential toxins like methotrexate  avoided, she should do quite well.  Pulmonary f/u with pfts can be prn   Total time devoted to counseling  = 35/35m review case with pt/ discussion of options/alternatives/ giving and going over instructions (see avs)

## 2015-08-04 NOTE — Assessment & Plan Note (Signed)
CT chest 02/17/15  . 6 mm subpleural right lower lobe nodule is unchanged from 06/22/2014. Additional followup in 1 year is recommended.   - placed in tickle file for recall 02/17/16   CT results reviewed with pt >>> Too small for PET or bx, not suspicious enough for excisional bx > really only option for now is follow the Fleischner society guidelines as rec by radiology for a final CT chest but given her RA and the very remote smoking hx this is in all likelihood completely benign

## 2015-12-16 ENCOUNTER — Other Ambulatory Visit (HOSPITAL_COMMUNITY): Payer: Self-pay | Admitting: *Deleted

## 2015-12-16 MED ORDER — ATORVASTATIN CALCIUM 20 MG PO TABS: 20.0000 mg | ORAL_TABLET | Freq: Every day | ORAL | Status: DC

## 2016-02-02 ENCOUNTER — Encounter: Payer: Self-pay | Admitting: *Deleted

## 2016-02-02 ENCOUNTER — Telehealth: Payer: Self-pay | Admitting: *Deleted

## 2016-02-02 NOTE — Telephone Encounter (Signed)
-----   Message from Nyoka Cowden, MD sent at 08/04/2015  6:13 AM EST ----- Ct chest w/o contrast due for f/u SPN

## 2016-02-02 NOTE — Telephone Encounter (Signed)
ATC pt and call will not go through  I have mailed her a letter to call us so we can scheduled ct chest for July 2017

## 2016-02-18 ENCOUNTER — Inpatient Hospital Stay: Admission: RE | Admit: 2016-02-18 | Payer: Medicare Other | Source: Ambulatory Visit

## 2016-02-18 DIAGNOSIS — R0989 Other specified symptoms and signs involving the circulatory and respiratory systems: Secondary | ICD-10-CM

## 2016-03-29 ENCOUNTER — Other Ambulatory Visit (HOSPITAL_COMMUNITY): Payer: Self-pay | Admitting: Cardiology

## 2016-03-29 ENCOUNTER — Other Ambulatory Visit: Payer: Self-pay

## 2016-03-29 MED ORDER — ATORVASTATIN CALCIUM 20 MG PO TABS: 20.0000 mg | ORAL_TABLET | Freq: Every day | ORAL | 0 refills | Status: AC

## 2016-06-05 ENCOUNTER — Other Ambulatory Visit (HOSPITAL_COMMUNITY): Payer: Self-pay | Admitting: Cardiology

## 2016-07-09 IMAGING — CT CT CHEST W/O CM
2 of 4 series · 15 of 36 positions shown, 18 images · IV contrast (Omnipaque 300)
Comparison: 12/15/2010

CLINICAL DATA: Lung nodule. Shortness of breath. QV08N: R 6.02.
History of smoking. Exertional dyspnea for 2 years.

EXAM:
CT CHEST WITHOUT CONTRAST
TECHNIQUE: Multidetector CT imaging of the chest was performed following the
standard protocol without IV contrast..

[Series 2: chest routine with · axial · 0.70mm/px · z∈[-280,-40]mm · 12 of 58 slices shown, 15 images]
[im 5/58  mediastinal]
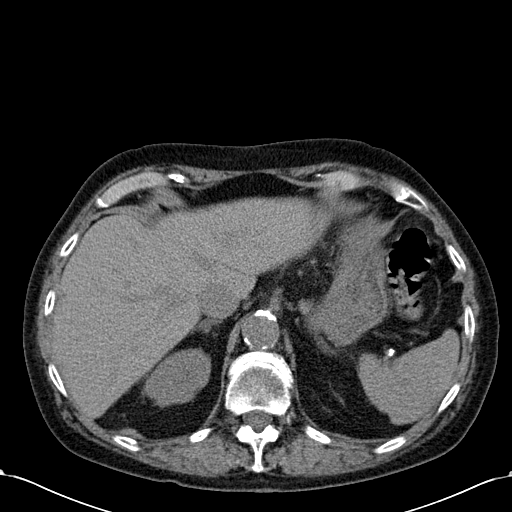
[im 5/58  lung]
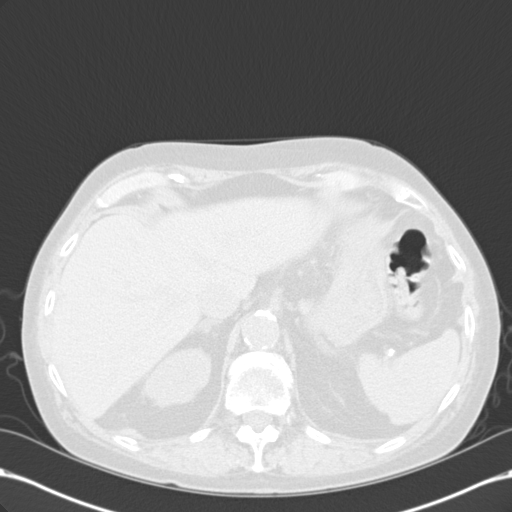
[im 9/58  lung]
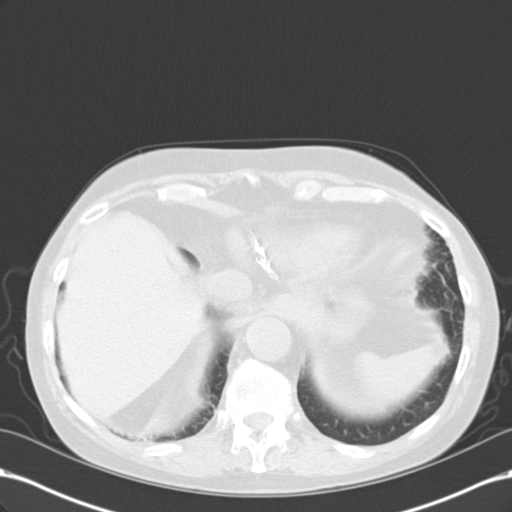
[im 14/58  lung]
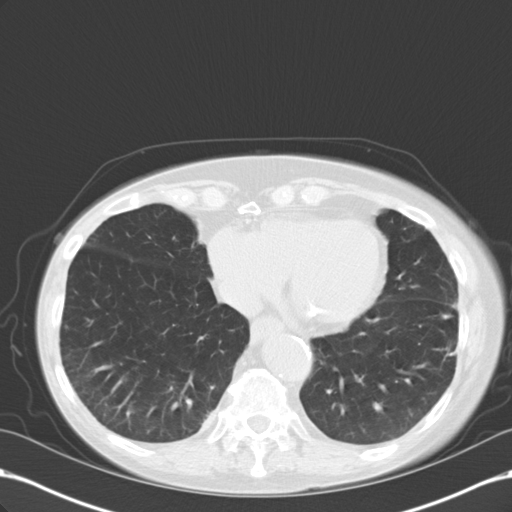
[im 18/58  lung]
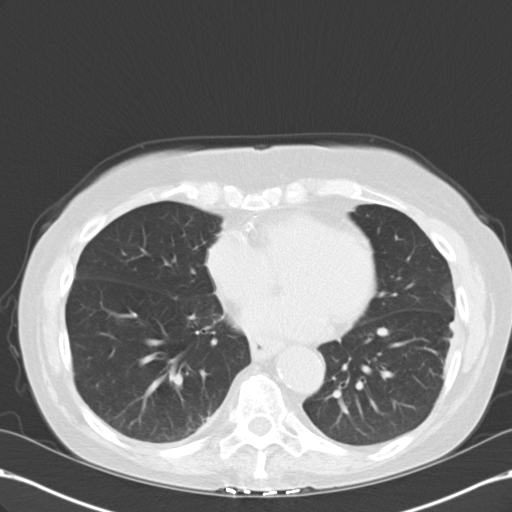
[im 22/58  mediastinal]
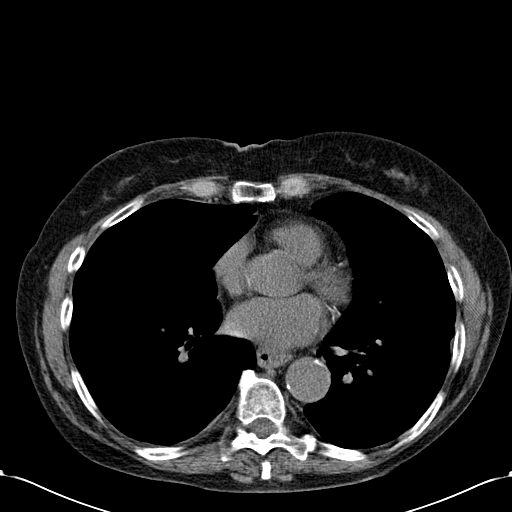
[im 22/58  lung]
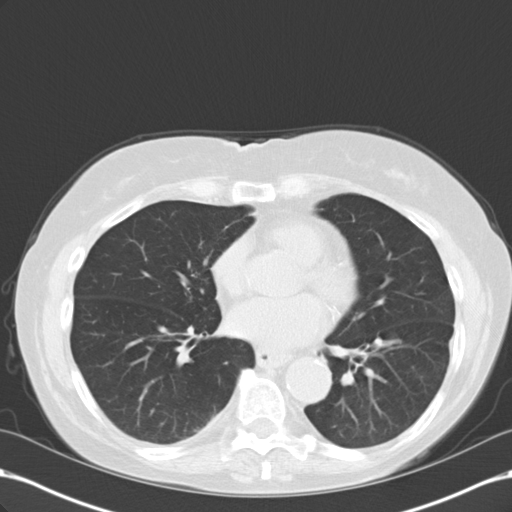
[im 27/58  lung]
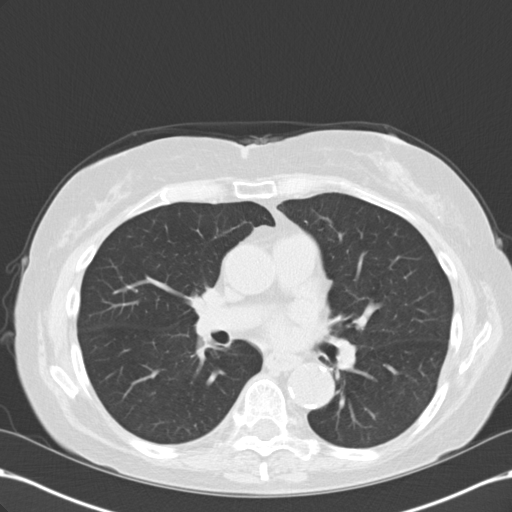
[im 31/58  lung]
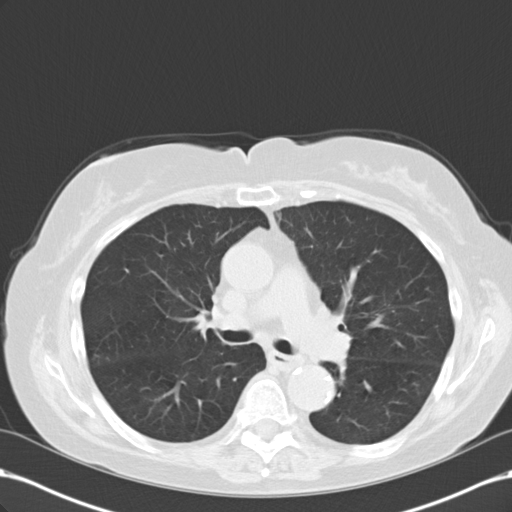
[im 36/58  lung]
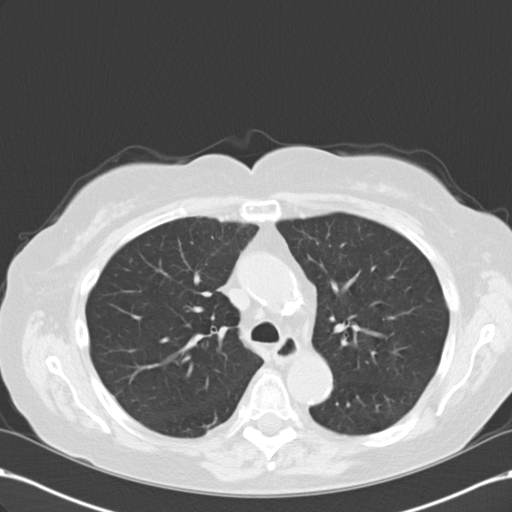
[im 40/58  mediastinal]
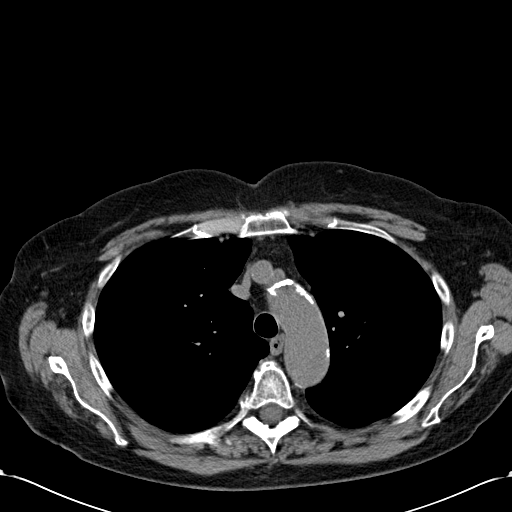
[im 40/58  lung]
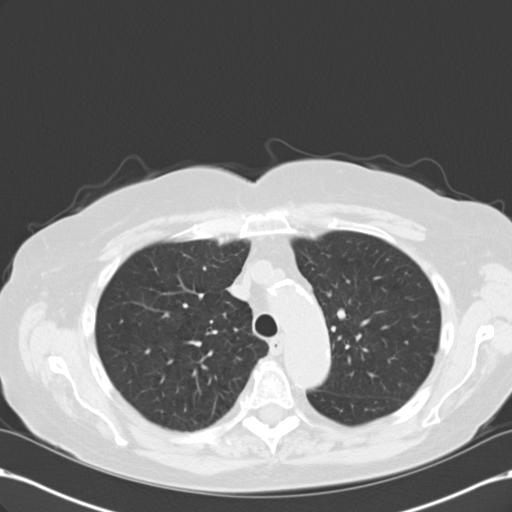
[im 44/58  lung]
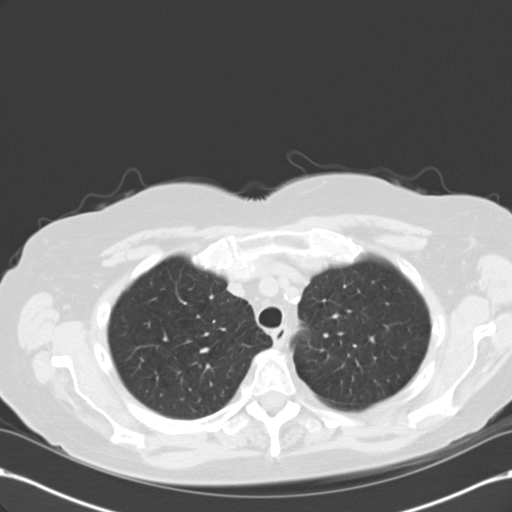
[im 49/58  lung]
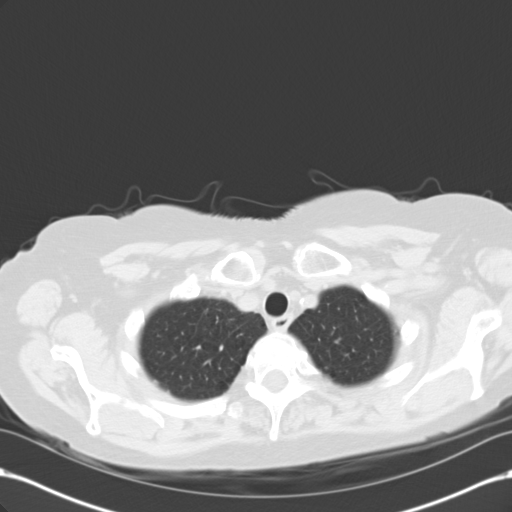
[im 53/58  lung]
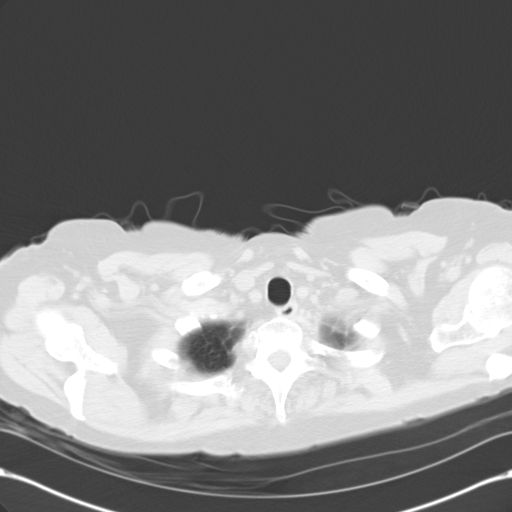

[Series 602: cor · coronal · 0.70mm/px · 3 of 104 slices shown]
[im 21/104  lung]
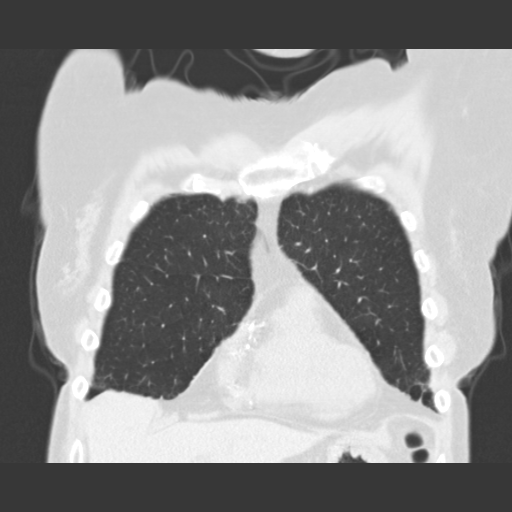
[im 42/104  lung]
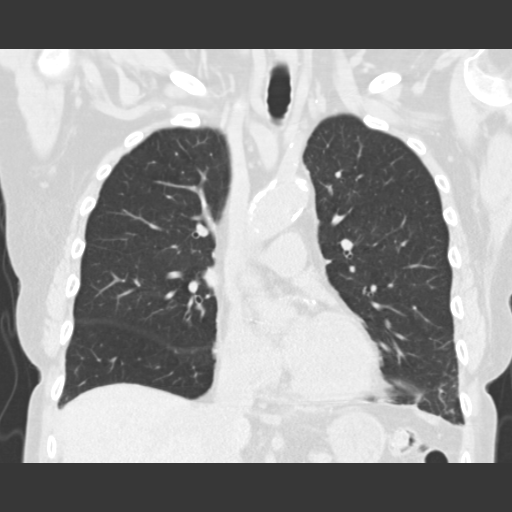
[im 62/104  lung]
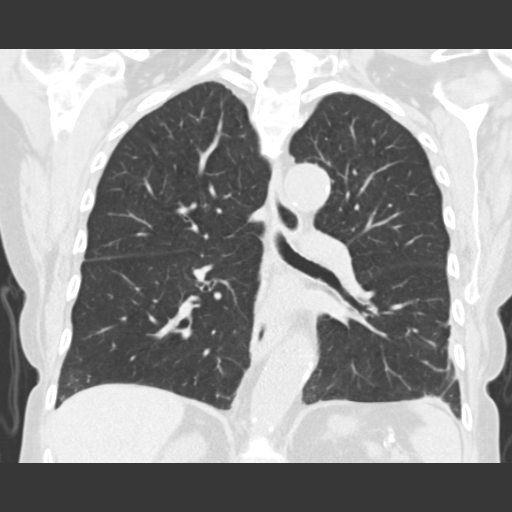

[15 of 36 positions shown; findings below may reference images not displayed]

FINDINGS: Lungs/Pleura:  Moderate centrilobular emphysema.

Perifissural right middle lobe 5 mm nodule on image 30 is similar to
the 8618 exam, consistent with a benign etiology.

A right lower lobe 6 mm nodule on image 46 of series 3 is either new
or enlarged since the prior exam.

Mild scarring at the left lung base.

Mild right sided pleural thickening is unchanged.  No pleural fluid.

Heart/Mediastinum: Aortic and branch vessel atherosclerosis. Normal
heart size, without pericardial effusion. Multivessel coronary
artery atherosclerosis. No mediastinal or definite hilar adenopathy,
given limitations of unenhanced CT. Suspect mild distal esophageal
wall thickening, including on image 41.

Upper Abdomen: Normal imaged portions of the liver, spleen, stomach,
adrenal glands, kidneys. Renal vascular calcifications.

Bones/Musculoskeletal: A severe compression deformity at the a L1
vertebral body is suboptimally and incompletely imaged but appears
grossly similar. This causes ventral canal encroachment.
IMPRESSION: 1. Centrilobular emphysema and advanced atherosclerosis. No other
explanation for shortness of breath.
2. A right middle lobe lung nodule is similar back to 8618,
consistent with a benign etiology.
3. A right lower lobe 6 mm nodule is either new or enlarged since
the prior. Follow up chest CT at 6-12 months is recommended. This
recommendation follows the consensus statement: "Guidelines for
Management of Small Pulmonary Nodules Detected on CT Scans: A
Statement from the [HOSPITAL]" as published in
RadiologyTHH1;[DATE]. Online at:
[URL]
4. Chronic L1 compression deformity, severe.
5. Suspicion of distal esophageal wall thickening, mild. Question
esophagitis.

## 2016-08-06 ENCOUNTER — Other Ambulatory Visit (HOSPITAL_COMMUNITY): Payer: Self-pay | Admitting: Cardiology

## 2016-09-15 ENCOUNTER — Other Ambulatory Visit (HOSPITAL_COMMUNITY): Payer: Self-pay | Admitting: Cardiology

## 2017-08-06 ENCOUNTER — Encounter: Payer: Medicare Other | Attending: Physician Assistant | Admitting: Physician Assistant

## 2017-08-06 DIAGNOSIS — E7849 Other hyperlipidemia: Secondary | ICD-10-CM | POA: Diagnosis not present

## 2017-08-06 DIAGNOSIS — Z87891 Personal history of nicotine dependence: Secondary | ICD-10-CM | POA: Insufficient documentation

## 2017-08-06 DIAGNOSIS — L89894 Pressure ulcer of other site, stage 4: Secondary | ICD-10-CM | POA: Diagnosis not present

## 2017-08-06 DIAGNOSIS — I4891 Unspecified atrial fibrillation: Secondary | ICD-10-CM | POA: Diagnosis not present

## 2017-08-06 DIAGNOSIS — I10 Essential (primary) hypertension: Secondary | ICD-10-CM | POA: Insufficient documentation

## 2017-08-06 DIAGNOSIS — M069 Rheumatoid arthritis, unspecified: Secondary | ICD-10-CM | POA: Insufficient documentation

## 2017-08-07 NOTE — Progress Notes (Signed)
Debbie Parrish, Debbie Parrish ([id number]) Visit Report for 08/06/2017 Abuse/Suicide Risk Screen Details Patient Name: Debbie Parrish, Debbie Parrish Date of Service: 08/06/2017 12:30 PM Medical Record Number: [id number] Patient Account Number: [id number] Date of Birth/Sex: 26-Jan-1927 (84 y.o. Female) Treating RN: Renne Crigler Primary Care Jerimyah Vandunk: Donata Duff Other Clinician: Referring Gokul Waybright: Referral, Self Treating Annslee Tercero/Extender: STONE III, HOYT Weeks in Treatment: 0 Abuse/Suicide Risk Screen Items Answer ABUSE/SUICIDE RISK SCREEN: Has anyone close to you tried to hurt or harm you recentlyo No Do you feel uncomfortable with anyone in your familyo No Has anyone forced you do things that you didnot want to doo No Do you have any thoughts of harming yourselfo No Patient displays signs or symptoms of abuse and/or neglect. No Electronic Signature(s) Signed: 08/06/2017 4:30:41 PM By: Renne Crigler Entered By: Renne Crigler on 08/06/2017 13:12:12 Debbie Parrish ([id number]) -------------------------------------------------------------------------------- Activities of Daily Living Details Patient Name: Debbie Parrish, Debbie Parrish Date of Service: 08/06/2017 12:30 PM Medical Record Number: [id number] Patient Account Number: [id number] Date of Birth/Sex: 04/17/1927 (84 y.o. Female) Treating RN: Renne Crigler Primary Care Kennth Vanbenschoten: Donata Duff Other Clinician: Referring Aarsh Fristoe: Referral, Self Treating Christabelle Hanzlik/Extender: STONE III, HOYT Weeks in Treatment: 0 Activities of Daily Living Items Answer Activities of Daily Living (Please select one for each item) Drive Automobile Completely Able Take Medications Completely Able Use Telephone Completely Able Care for Appearance Completely Able Use Toilet Completely Able Bath / Shower Completely Able Dress Self Completely Able Feed Self Completely Able Walk Completely Able Get In / Out Bed Completely Able Housework Completely Able Prepare Meals  Completely Able Handle Money Completely Able Shop for Self Completely Able Electronic Signature(s) Signed: 08/06/2017 4:30:41 PM By: Renne Crigler Entered By: Renne Crigler on 08/06/2017 13:12:39 Debbie Parrish ([id number]) -------------------------------------------------------------------------------- Education Assessment Details Patient Name: Debbie Parrish Date of Service: 08/06/2017 12:30 PM Medical Record Number: [id number] Patient Account Number: [id number] Date of Birth/Sex: [birth year] (84 y.o. Female) Treating RN: Renne Crigler Primary Care Perris Tripathi: Donata Duff Other Clinician: Referring Kemari Narez: Referral, Self Treating Jantz Main/Extender: Linwood Dibbles, HOYT Weeks in Treatment: 0 Primary Learner Assessed: Patient Learning Preferences/Education Level/Primary Language Learning Preference: Explanation, Printed Material Highest Education Level: College or Above Preferred Language: English Cognitive Barrier Assessment/Beliefs Language Barrier: No Translator Needed: No Memory Deficit: No Emotional Barrier: No Cultural/Religious Beliefs Affecting Medical Care: No Physical Barrier Assessment Impaired Vision: Yes Glasses Impaired Hearing: No Decreased Hand dexterity: No Knowledge/Comprehension Assessment Knowledge Level: Medium Comprehension Level: Medium Ability to understand written Medium instructions: Ability to understand verbal Medium instructions: Motivation Assessment Anxiety Level: Calm Cooperation: Cooperative Education Importance: Acknowledges Need Interest in Health Problems: Asks Questions Perception: Coherent Willingness to Engage in Self- Medium Management Activities: Readiness to Engage in Self- Medium Management Activities: Electronic Signature(s) Signed: 08/06/2017 4:30:41 PM By: Renne Crigler Entered By: Renne Crigler on 08/06/2017 13:13:00 Debbie Parrish  ([id number]) -------------------------------------------------------------------------------- Fall Risk Assessment Details Patient Name: Debbie Parrish Date of Service: 08/06/2017 12:30 PM Medical Record Number: [id number] Patient Account Number: [id number] Date of Birth/Sex: 09-05-1926 (84 y.o. Female) Treating RN: Renne Crigler Primary Care Krystol Rocco: Donata Duff Other Clinician: Referring Keyston Ardolino: Referral, Self Treating Clarity Ciszek/Extender: STONE III, HOYT Weeks in Treatment: 0 Fall Risk Assessment Items Have you had 2 or more falls in the last 12 monthso 0 No Have you had any fall that resulted in injury in the last 12 monthso 0 No FALL RISK ASSESSMENT: History of falling - immediate or within 3 months 0 No Secondary diagnosis 15 Yes Ambulatory aid None/bed rest/wheelchair/nurse 0 No Crutches/cane/walker 0 No Furniture 0 No IV Access/Saline Lock 0  No Gait/Training Normal/bed rest/immobile 0 No Weak 0 No Impaired 0 No Mental Status Oriented to own ability 0 Yes Electronic Signature(s) Signed: 08/06/2017 4:30:41 PM By: Renne Crigler Entered By: Renne Crigler on 08/06/2017 13:13:18 Debbie Parrish ([id number]) -------------------------------------------------------------------------------- Foot Assessment Details Patient Name: Debbie Parrish Date of Service: 08/06/2017 12:30 PM Medical Record Number: [id number] Patient Account Number: [id number] Date of Birth/Sex: Jan 01, 1927 (84 y.o. Female) Treating RN: Renne Crigler Primary Care Torian Quintero: Donata Duff Other Clinician: Referring Cristopher Ciccarelli: Referral, Self Treating Genevieve Ritzel/Extender: STONE III, HOYT Weeks in Treatment: 0 Foot Assessment Items Site Locations + = Sensation present, - = Sensation absent, C = Callus, U = Ulcer R = Redness, W = Warmth, M = Maceration, PU = Pre-ulcerative lesion F = Fissure, S = Swelling, D = Dryness Assessment Right: Left: Other Deformity: No No Prior Foot Ulcer: No No Prior  Amputation: No No Charcot Joint: No No Ambulatory Status: Ambulatory Without Help Gait: Steady Electronic Signature(s) Signed: 08/06/2017 4:30:41 PM By: Renne Crigler Entered By: Renne Crigler on 08/06/2017 13:14:47 Debbie Parrish ([id number]) -------------------------------------------------------------------------------- Nutrition Risk Assessment Details Patient Name: Debbie Parrish Date of Service: 08/06/2017 12:30 PM Medical Record Number: [id number] Patient Account Number: [id number] Date of Birth/Sex: [birth year] (84 y.o. Female) Treating RN: Renne Crigler Primary Care Hillis Mcphatter: Donata Duff Other Clinician: Referring Adean Milosevic: Referral, Self Treating Rishav Rockefeller/Extender: STONE III, HOYT Weeks in Treatment: 0 Height (in): 67 Weight (lbs): 122 Body Mass Index (BMI): 19.1 Nutrition Risk Assessment Items NUTRITION RISK SCREEN: I have an illness or condition that made me change the kind and/or amount of 0 No food I eat I eat fewer than two meals per day 3 Yes I eat few fruits and vegetables, or milk products 0 No I have three or more drinks of beer, liquor or wine almost every day 0 No I have tooth or mouth problems that make it hard for me to eat 0 No I don't always have enough money to buy the food I need 0 No I eat alone most of the time 0 No I take three or more different prescribed or over-the-counter drugs a day 1 Yes Without wanting to, I have lost or gained 10 pounds in the last six months 0 No I am not always physically able to shop, cook and/or feed myself 0 No Nutrition Protocols Good Risk Protocol Moderate Risk Protocol Electronic Signature(s) Signed: 08/06/2017 4:30:41 PM By: Renne Crigler Entered By: Renne Crigler on 08/06/2017 13:13:48

## 2017-08-08 NOTE — Progress Notes (Signed)
ALLIAH, BOULANGER ([id number]) Visit Report for 08/06/2017 Chief Complaint Document Details Patient Name: Debbie Parrish, Debbie Parrish Date of Service: 08/06/2017 12:30 PM Medical Record Number: [id number] Patient Account Number: [id number] Date of Birth/Sex: [birth year] (84 y.o. Female) Treating RN: Renne Crigler Primary Care Provider: Donata Duff Other Clinician: Referring Provider: Referral, Self Treating Provider/Extender: Linwood Dibbles, Jarika Robben Weeks in Treatment: 0 Information Obtained from: Patient Chief Complaint Right Medial 2nd Toe Ulcer Electronic Signature(s) Signed: 08/08/2017 11:24:19 AM By: Lenda Kelp PA-C Entered By: Lenda Kelp on 08/06/2017 15:56:18 Debbie Parrish ([id number]) -------------------------------------------------------------------------------- HPI Details Patient Name: Debbie Parrish Date of Service: 08/06/2017 12:30 PM Medical Record Number: [id number] Patient Account Number: [id number] Date of Birth/Sex: 11-Jan-1927 (84 y.o. Female) Treating RN: Renne Crigler Primary Care Provider: Donata Duff Other Clinician: Referring Provider: Referral, Self Treating Provider/Extender: STONE III, Jaylynne Birkhead Weeks in Treatment: 0 History of Present Illness Associated Signs and Symptoms: Patient has a history of hypertension, hyperlipidemia, and a bunion of the right foot. HPI Description: 08/06/17 patient presents today for a consultation/2nd opinion concerning an ulcer that she has on the right second toe immediately. She has been seen at the wound care center in [place] at [facility] by Dr. Nathanial Millman and per patient this has been going on since September 2018. There has been no definitive diagnosis of osteomyelitis by x-ray nor MRI permanent review of the most recent note which was dated 08/03/17 where this is explicitly stated. Gentamicin was added to the treatment regimen topically along with doxycycline orally at that time of evaluation and patient is not  a diabetic patient. She is also a non-smoker. Her ABI's are high normal range. Patient is actually scheduled to have a right second toe amputation on August 13, 2017. With that being said the reason she is seen in our clinic today is due to the fact that she is not sure if the amputation is the right thing to do and after speaking with family especially her daughter-in-law who was a nurse she wanted to obtain a second opinion prior to going forward with the surgery. She has been tolerating the dressing changes well and does not seem to have any evidence of infection which is good news. No fevers, chills, nausea, or vomiting noted at this time. A collagen dressing has been used at this time. Electronic Signature(s) Signed: 08/08/2017 11:24:19 AM By: Lenda Kelp PA-C Entered By: Lenda Kelp on 08/06/2017 16:04:16 Debbie Parrish ([id number]) -------------------------------------------------------------------------------- Physical Exam Details Patient Name: Debbie Parrish Date of Service: 08/06/2017 12:30 PM Medical Record Number: [id number] Patient Account Number: [id number] Date of Birth/Sex: 08/25/1926 (84 y.o. Female) Treating RN: Renne Crigler Primary Care Provider: Donata Duff Other Clinician: Referring Provider: Referral, Self Treating Provider/Extender: STONE III, Strider Vallance Weeks in Treatment: 0 Constitutional patient is hypertensive.. pulse regular and within target range for patient.Marland Kitchen respirations regular, non-labored and within target range for patient.Marland Kitchen temperature within target range for patient.. Thin and well-hydrated in no acute distress. Eyes conjunctiva clear no eyelid edema noted. pupils equal round and reactive to light and accommodation. Ears, Nose, Mouth, and Throat no gross abnormality of ear auricles or external auditory canals. normal hearing noted during conversation. mucus membranes moist. Respiratory normal breathing without difficulty. clear to  auscultation bilaterally. Cardiovascular regular rate and rhythm with normal S1, S2. 2+ dorsalis pedis/posterior tibialis pulses. no clubbing, cyanosis, significant edema, <3 sec cap refill. Gastrointestinal (GI) soft, non-tender, non-distended, +BS. no ventral hernia noted. Musculoskeletal normal gait and posture. Psychiatric this patient is able to make decisions  and demonstrates good insight into disease process. Alert and Oriented x 3. pleasant and cooperative. Notes On evaluation today the ulceration appears to be surrounded by an area of callous due to chronic friction and pressure. Subsequently this wound does probe to bone though there does not appear to be any significant evidence of infection at this time. It has not been evaluated at this point for the possibility of osteomyelitis. With that being said due to the bunion on this side the great toe does push right into this site which I believe is what has caused the ulceration to begin with. Patient also states that she was wearing tight shoes due to different functions that she had to attend which may have caused this to break down as well. Note purulent discharge noted at this time. Electronic Signature(s) Signed: 08/08/2017 11:24:19 AM By: Lenda Kelp PA-C Entered By: Lenda Kelp on 08/06/2017 16:07:30 Debbie Parrish ([id number]) -------------------------------------------------------------------------------- Physician Orders Details Patient Name: Debbie Parrish Date of Service: 08/06/2017 12:30 PM Medical Record Number: [id number] Patient Account Number: [id number] Date of Birth/Sex: 1926/08/21 (84 y.o. Female) Treating RN: Renne Crigler Primary Care Provider: Donata Duff Other Clinician: Referring Provider: Referral, Self Treating Provider/Extender: STONE III, Susette Seminara Weeks in Treatment: 0 Verbal / Phone Orders: No Diagnosis Coding Wound Cleansing Wound #1 Right Toe Second o Clean wound with Normal  Saline. o May Shower, gently pat wound dry prior to applying new dressing. Anesthetic (add to Medication List) Wound #1 Right Toe Second o Topical Lidocaine 4% cream applied to wound bed prior to debridement (In Clinic Only). Primary Wound Dressing Wound #1 Right Toe Second o Other: - silvercell Secondary Dressing Wound #1 Right Toe Second o Foam - place between great toe and first toe then wrap with gauze wrap Dressing Change Frequency o Change dressing every other day. Follow-up Appointments Wound #1 Right Toe Second o Return Appointment in: - patient will call for follow up appointment Electronic Signature(s) Signed: 08/06/2017 4:30:41 PM By: Renne Crigler Signed: 08/08/2017 11:24:19 AM By: Lenda Kelp PA-C Entered By: Renne Crigler on 08/06/2017 14:01:03 Debbie Parrish ([id number]) -------------------------------------------------------------------------------- Problem List Details Patient Name: Debbie Parrish Date of Service: 08/06/2017 12:30 PM Medical Record Number: [id number] Patient Account Number: [id number] Date of Birth/Sex: 1926-10-30 (84 y.o. Female) Treating RN: Renne Crigler Primary Care Provider: Donata Duff Other Clinician: Referring Provider: Referral, Self Treating Provider/Extender: STONE III, Wilkes Potvin Weeks in Treatment: 0 Active Problems ICD-10 Encounter Code Description Active Date Diagnosis L89.894 Pressure ulcer of other site, stage 4 08/06/2017 Yes I10 Essential (primary) hypertension 08/06/2017 Yes E78.49 Other hyperlipidemia 08/06/2017 Yes Inactive Problems Resolved Problems Electronic Signature(s) Signed: 08/08/2017 11:24:19 AM By: Lenda Kelp PA-C Entered By: Lenda Kelp on 08/06/2017 16:05:21 Debbie Parrish ([id number]) -------------------------------------------------------------------------------- Progress Note Details Patient Name: Debbie Parrish Date of Service: 08/06/2017 12:30 PM Medical Record Number:  [id number] Patient Account Number: [id number] Date of Birth/Sex: 15-Sep-1926 (84 y.o. Female) Treating RN: Renne Crigler Primary Care Provider: Donata Duff Other Clinician: Referring Provider: Referral, Self Treating Provider/Extender: STONE III, Aydden Cumpian Weeks in Treatment: 0 Subjective Chief Complaint Information obtained from Patient Right Medial 2nd Toe Ulcer History of Present Illness (HPI) The following HPI elements were documented for the patient's wound: Associated Signs and Symptoms: Patient has a history of hypertension, hyperlipidemia, and a bunion of the right foot. 08/06/17 patient presents today for a consultation/2nd opinion concerning an ulcer that she has on the right second toe immediately. She has been seen at the wound care center in [facility]  at [facility] by Dr. Nathanial Millman and per patient this has been going on since September 2018. There has been no definitive diagnosis of osteomyelitis by x-ray nor MRI permanent review of the most recent note which was dated 08/03/17 where this is explicitly stated. Gentamicin was added to the treatment regimen topically along with doxycycline orally at that time of evaluation and patient is not a diabetic patient. She is also a non-smoker. Her ABI's are high normal range. Patient is actually scheduled to have a right second toe amputation on August 13, 2017. With that being said the reason she is seen in our clinic today is due to the fact that she is not sure if the amputation is the right thing to do and after speaking with family especially her daughter-in-law who was a nurse she wanted to obtain a second opinion prior to going forward with the surgery. She has been tolerating the dressing changes well and does not seem to have any evidence of infection which is good news. No fevers, chills, nausea, or vomiting noted at this time. A collagen dressing has been used at this time. Wound History Patient presents with 1 open  wound that has been present for approximately sept 2018. Patient has been treating wound in the following manner: wound care center gave puracol. Laboratory tests have not been performed in the last month. Patient reportedly has not tested positive for an antibiotic resistant organism. Patient reportedly has not tested positive for osteomyelitis. Patient reportedly has had testing performed to evaluate circulation in the legs. Patient History Information obtained from Patient. Allergies sulfur Family History Cancer - Father, Thyroid Problems - Mother,Siblings, No family history of Diabetes, Heart Disease, Hereditary Spherocytosis, Hypertension, Kidney Disease, Lung Disease, Seizures, Stroke, Tuberculosis. Social History Former smoker - quit 1990, Marital Status - Widowed, Alcohol Use - Never, Drug Use - No History, Caffeine Use - Daily. Medical History Eyes Patient has history of Cataracts - surgery Cardiovascular JENAY, MORICI ([id number]) Patient has history of Arrhythmia - a-fib Musculoskeletal Patient has history of Rheumatoid Arthritis Review of Systems (ROS) Constitutional Symptoms (General Health) The patient has no complaints or symptoms. Eyes Complains or has symptoms of Glasses / Contacts. Ear/Nose/Mouth/Throat The patient has no complaints or symptoms. Hematologic/Lymphatic The patient has no complaints or symptoms. Respiratory The patient has no complaints or symptoms. Gastrointestinal The patient has no complaints or symptoms. Endocrine Complains or has symptoms of Thyroid disease. Genitourinary The patient has no complaints or symptoms. Immunological The patient has no complaints or symptoms. Integumentary (Skin) Complains or has symptoms of Wounds. Neurologic The patient has no complaints or symptoms. Oncologic The patient has no complaints or symptoms. Psychiatric Complains or has symptoms of Anxiety. Denies complaints or symptoms of  Claustrophobia. Objective Constitutional patient is hypertensive.. pulse regular and within target range for patient.Marland Kitchen respirations regular, non-labored and within target range for patient.Marland Kitchen temperature within target range for patient.. Thin and well-hydrated in no acute distress. Vitals Time Taken: 12:51 PM, Height: 67 in, Source: Measured, Weight: 122 lbs, Source: Measured, BMI: 19.1, Temperature: 97.9 F, Pulse: 76 bpm, Respiratory Rate: 18 breaths/min, Blood Pressure: 169/90 mmHg. Eyes conjunctiva clear no eyelid edema noted. pupils equal round and reactive to light and accommodation. Ears, Nose, Mouth, and Throat no gross abnormality of ear auricles or external auditory canals. normal hearing noted during conversation. mucus membranes moist. Respiratory Cordone, Williette ([id number]) normal breathing without difficulty. clear to auscultation bilaterally. Cardiovascular regular rate and rhythm with normal S1, S2. 2+ dorsalis pedis/posterior  tibialis pulses. no clubbing, cyanosis, significant edema, Gastrointestinal (GI) soft, non-tender, non-distended, +BS. no ventral hernia noted. Musculoskeletal normal gait and posture. Psychiatric this patient is able to make decisions and demonstrates good insight into disease process. Alert and Oriented x 3. pleasant and cooperative. General Notes: On evaluation today the ulceration appears to be surrounded by an area of callous due to chronic friction and pressure. Subsequently this wound does probe to bone though there does not appear to be any significant evidence of infection at this time. It has not been evaluated at this point for the possibility of osteomyelitis. With that being said due to the bunion on this side the great toe does push right into this site which I believe is what has caused the ulceration to begin with. Patient also states that she was wearing tight shoes due to different functions that she had to attend which may have  caused this to break down as well. Note purulent discharge noted at this time. Integumentary (Hair, Skin) Wound #1 status is Open. Original cause of wound was Gradually Appeared. The wound is located on the Right Toe Second. The wound measures 0.5cm length x 0.6cm width x 0.6cm depth; 0.236cm^2 area and 0.141cm^3 volume. There is bone and Fat Layer (Subcutaneous Tissue) Exposed exposed. There is no tunneling or undermining noted. There is a small amount of serous drainage noted. The wound margin is distinct with the outline attached to the wound base. There is small (1-33%) pink granulation within the wound bed. There is a large (67-100%) amount of necrotic tissue within the wound bed including Adherent Slough. The periwound skin appearance exhibited: Callus. The periwound skin appearance did not exhibit: Crepitus, Excoriation, Induration, Rash, Scarring, Dry/Scaly, Maceration, Atrophie Blanche, Cyanosis, Ecchymosis, Hemosiderin Staining, Mottled, Pallor, Rubor, Erythema. Periwound temperature was noted as No Abnormality. Assessment Active Problems ICD-10 L89.894 - Pressure ulcer of other site, stage 4 I10 - Essential (primary) hypertension E78.49 - Other hyperlipidemia Plan Wound Cleansing: Wound #1 Right Toe Second: Clean wound with Normal Saline. May Shower, gently pat wound dry prior to applying new dressing. Anesthetic (add to Medication List): Wound #1 Right Toe SecondDONDRA, SHIVERDECKER ([id number]) Topical Lidocaine 4% cream applied to wound bed prior to debridement (In Clinic Only). Primary Wound Dressing: Wound #1 Right Toe Second: Other: - silvercell Secondary Dressing: Wound #1 Right Toe Second: Foam - place between great toe and first toe then wrap with gauze wrap Dressing Change Frequency: Change dressing every other day. Follow-up Appointments: Wound #1 Right Toe Second: Return Appointment in: - patient will call for follow up appointment I had a rather lengthy  conversation with patient as well as her nephew who brought her in for evaluation today. Upon further discussion I do believe that I can understand Dr. Arvil Persons thoughts on her current treatment plan which is amputation of the right second toe. Due to the bunion she is having continual pressure on this medial aspect of the second toe. Therefore one option surgically for her would be bunion surgery to correct this. Nonetheless of that versus amputation of the second toe patient states that it was felt by Dr. Nathanial Millman the amputation would be a much better tolerated surgery and much less difficult on her as far as healing is concerned. Obviously her concern at this point was that she wasn't sure she wanted to have surgery at all. Nonetheless after being able to piece together what this reasoning was for why patient was recommended to have the amputation she did feel  somewhat better about this as a possibility. In regard to the nonsurgical possibility I did explain to her that a lot would depend on whether or not she has osteomyelitis noted in that second toe. If so this will require six weeks of IV antibiotics along with good wound care and then if things will not improving as appropriate at that point possibly HBO therapy. With that being said if there is no evidence of bone infection then we could potentially proceed with just wound care and offloading to try and get this wound to heal. The problem down that road is that she is going to continually be at risk for reopening due to pressure secondary to the bunion. With that being said if she would want to come here or [facility] for wound care we obviously could do that and that is completely her choice in that regard. In the end I left this consultation in her hands and she states that her daughter-in-law may call to discuss as well which is definitely okay. The biggest decision she needs to make at this point is whether she wants to proceed with the  amputation, get another second opinion from a surgeon concerning the amputation (if so I recommended Dr. Victorino Dike at [facility] orthopedics), or possibly just proceed with good wound care whether here or at a different wound care center. In the latter case we would be obviously attempting to heal the wound and then would be focusing on maintaining closure which in large part would include patient wearing likely open toed shoes in order to avoid any pressure to her second toe. Obviously this is a lot to consider and make a decision on just during evaluation today and therefore patient is going to advise following discussion with her daughter-in-law which direction she would like to go. Otherwise we will see her for reevaluation in the future as needed if she has any other concerns. Again this was a one time consultation/2nd opinion concerning patient's current wound status and treatment options going forward. Electronic Signature(s) Signed: 08/08/2017 11:24:19 AM By: Lenda Kelp PA-C Entered By: Lenda Kelp on 08/06/2017 16:16:41 ALEXIE, MANGRUM ([id number]) [place], Kathie Rhodes ([id number]) -------------------------------------------------------------------------------- ROS/PFSH Details Patient Name: Debbie Parrish Date of Service: 08/06/2017 12:30 PM Medical Record Number: [id number] Patient Account Number: [id number] Date of Birth/Sex: January 21, 1927 (84 y.o. Female) Treating RN: Renne Crigler Primary Care Provider: Donata Duff Other Clinician: Referring Provider: Referral, Self Treating Provider/Extender: STONE III, Manaal Mandala Weeks in Treatment: 0 Information Obtained From Patient Wound History Do you currently have one or more open woundso Yes How many open wounds do you currently haveo 1 Approximately how long have you had your woundso sept 2018 How have you been treating your wound(s) until nowo wound care center gave puracol Has your wound(s) ever healed and then re-openedo No Have you  had any lab work done in the past montho No Have you tested positive for an antibiotic resistant organism (MRSA, VRE)o No Have you tested positive for osteomyelitis (bone infection)o No Have you had any tests for circulation on your legso Yes Where was the test doneo [place] Eyes Complaints and Symptoms: Positive for: Glasses / Contacts Medical History: Positive for: Cataracts - surgery Endocrine Complaints and Symptoms: Positive for: Thyroid disease Integumentary (Skin) Complaints and Symptoms: Positive for: Wounds Psychiatric Complaints and Symptoms: Positive for: Anxiety Negative for: Claustrophobia Constitutional Symptoms (General Health) Complaints and Symptoms: No Complaints or Symptoms Ear/Nose/Mouth/Throat Complaints and Symptoms: No Complaints or Symptoms Hematologic/Lymphatic ESTREYA, TENISON ([id number]) Complaints and Symptoms:  No Complaints or Symptoms Respiratory Complaints and Symptoms: No Complaints or Symptoms Cardiovascular Medical History: Positive for: Arrhythmia - a-fib Gastrointestinal Complaints and Symptoms: No Complaints or Symptoms Genitourinary Complaints and Symptoms: No Complaints or Symptoms Immunological Complaints and Symptoms: No Complaints or Symptoms Musculoskeletal Medical History: Positive for: Rheumatoid Arthritis Neurologic Complaints and Symptoms: No Complaints or Symptoms Oncologic Complaints and Symptoms: No Complaints or Symptoms HBO Extended History Items Eyes: Cataracts Immunizations Pneumococcal Vaccine: Received Pneumococcal Vaccination: Yes Implantable Devices Family and Social History Cancer: Yes - Father; Diabetes: No; Heart Disease: No; Hereditary Spherocytosis: No; Hypertension: No; Kidney Disease: No; Lung Disease: No; Seizures: No; Stroke: No; Thyroid Problems: Yes - Mother,Siblings; Tuberculosis: No; Former smoker - quit 1990; Marital Status - Widowed; Alcohol Use: Never; Drug Use: No History;  Caffeine Use: Daily; Financial Concerns: No; Food, Clothing or Shelter Needs: No; Support System Lacking: No; Transportation Concerns: No; Advanced DirectivesJANNETTE, COTHAM ([id number]) No; Patient does not want information on Advanced Directives; Do not resuscitate: No; Living Will: No; Medical Power of Attorney: No Electronic Signature(s) Signed: 08/06/2017 4:30:41 PM By: Renne Crigler Signed: 08/08/2017 11:24:19 AM By: Lenda Kelp PA-C Entered By: Renne Crigler on 08/06/2017 13:12:00 Debbie Parrish ([id number]) -------------------------------------------------------------------------------- SuperBill Details Patient Name: Debbie Parrish Date of Service: 08/06/2017 Medical Record Number: [id number] Patient Account Number: [id number] Date of Birth/Sex: [birth year] (84 y.o. Female) Treating RN: Renne Crigler Primary Care Provider: Donata Duff Other Clinician: Referring Provider: Referral, Self Treating Provider/Extender: STONE III, Seairra Otani Weeks in Treatment: 0 Diagnosis Coding ICD-10 Codes Code Description L89.894 Pressure ulcer of other site, stage 4 I10 Essential (primary) hypertension E78.49 Other hyperlipidemia Facility Procedures CPT4 Code: [id number] Description: 99213 - WOUND CARE VISIT-LEV 3 EST PT Modifier: Quantity: 1 Physician Procedures CPT4 Code: 6389373 Description: 99204 - WC PHYS LEVEL 4 - NEW PT ICD-10 Diagnosis Description L89.894 Pressure ulcer of other site, stage 4 I10 Essential (primary) hypertension E78.49 Other hyperlipidemia Modifier: Quantity: 1 Electronic Signature(s) Signed: 08/08/2017 11:24:19 AM By: Lenda Kelp PA-C Entered By: Lenda Kelp on 08/06/2017 16:17:45

## 2017-08-08 NOTE — Progress Notes (Signed)
SAMIJO, GOVERN ([id number]) Visit Report for 08/06/2017 Allergy List Details Patient Name: Debbie Parrish, Debbie Parrish Date of Service: 08/06/2017 12:30 PM Medical Record Number: [id number] Patient Account Number: [id number] Date of Birth/Sex: 14-Mar-1927 (84 y.o. Female) Treating RN: Renne Crigler Primary Care Harla Mensch: Donata Duff Other Clinician: Referring Aldean Pipe: Referral, Self Treating Zaila Crew/Extender: STONE III, HOYT Weeks in Treatment: 0 Allergies Active Allergies sulfur Allergy Notes Electronic Signature(s) Signed: 08/06/2017 4:30:41 PM By: Renne Crigler Entered By: Renne Crigler on 08/06/2017 13:06:00 Debbie Parrish ([id number]) -------------------------------------------------------------------------------- Arrival Information Details Patient Name: Debbie Parrish Date of Service: 08/06/2017 12:30 PM Medical Record Number: [id number] Patient Account Number: [id number] Date of Birth/Sex: 1926-11-24 (84 y.o. Female) Treating RN: Renne Crigler Primary Care Rudean Icenhour: Donata Duff Other Clinician: Referring Wille Aubuchon: Referral, Self Treating Heidi Maclin/Extender: Linwood Dibbles, HOYT Weeks in Treatment: 0 Visit Information Patient Arrived: Ambulatory Arrival Time: 12:50 Accompanied By: nephew Transfer Assistance: None Patient Identification Verified: Yes Secondary Verification Process Yes Completed: Patient Has Alerts: Yes Patient Alerts: Patient on Blood Thinner ABI non-compressible Xarelto Electronic Signature(s) Signed: 08/06/2017 4:30:41 PM By: Renne Crigler Entered By: Renne Crigler on 08/06/2017 13:05:30 Debbie Parrish ([id number]) -------------------------------------------------------------------------------- Clinic Level of Care Assessment Details Patient Name: Debbie Parrish Date of Service: 08/06/2017 12:30 PM Medical Record Number: [id number] Patient Account Number: [id number] Date of Birth/Sex: August 03, 1927 (84 y.o. Female) Treating RN: Renne Crigler Primary Care Manpreet Kemmer: Donata Duff Other Clinician: Referring Aide Wojnar: Referral, Self Treating Izaya Netherton/Extender: STONE III, HOYT Weeks in Treatment: 0 Clinic Level of Care Assessment Items TOOL 2 Quantity Score X - Use when only an EandM is performed on the INITIAL visit 1 0 ASSESSMENTS - Nursing Assessment / Reassessment X - General Physical Exam (combine w/ comprehensive assessment (listed just below) when 1 20 performed on new pt. evals) X- 1 25 Comprehensive Assessment (HX, ROS, Risk Assessments, Wounds Hx, etc.) ASSESSMENTS - Wound and Skin Assessment / Reassessment X - Simple Wound Assessment / Reassessment - one wound 1 5 []  - 0 Complex Wound Assessment / Reassessment - multiple wounds []  - 0 Dermatologic / Skin Assessment (not related to wound area) ASSESSMENTS - Ostomy and/or Continence Assessment and Care []  - Incontinence Assessment and Management 0 []  - 0 Ostomy Care Assessment and Management (repouching, etc.) PROCESS - Coordination of Care X - Simple Patient / Family Education for ongoing care 1 15 []  - 0 Complex (extensive) Patient / Family Education for ongoing care []  - 0 Staff obtains Chiropractor, Records, Test Results / Process Orders []  - 0 Staff telephones HHA, Nursing Homes / Clarify orders / etc []  - 0 Routine Transfer to another Facility (non-emergent condition) []  - 0 Routine Hospital Admission (non-emergent condition) []  - 0 New Admissions / Manufacturing engineer / Ordering NPWT, Apligraf, etc. []  - 0 Emergency Hospital Admission (emergent condition) X- 1 10 Simple Discharge Coordination []  - 0 Complex (extensive) Discharge Coordination PROCESS - Special Needs []  - Pediatric / Minor Patient Management 0 []  - 0 Isolation Patient Management IDELLE, BOEDEKER ([id number]) []  - 0 Hearing / Language / Visual special needs []  - 0 Assessment of Community assistance (transportation, D/C planning, etc.) []  - 0 Additional assistance /  Altered mentation []  - 0 Support Surface(s) Assessment (bed, cushion, seat, etc.) INTERVENTIONS - Wound Cleansing / Measurement X - Wound Imaging (photographs - any number of wounds) 1 5 []  - 0 Wound Tracing (instead of photographs) X- 1 5 Simple Wound Measurement - one wound []  - 0 Complex Wound Measurement - multiple wounds X- 1 5 Simple Wound Cleansing - one wound []  -  0 Complex Wound Cleansing - multiple wounds INTERVENTIONS - Wound Dressings X - Small Wound Dressing one or multiple wounds 1 10 []  - 0 Medium Wound Dressing one or multiple wounds []  - 0 Large Wound Dressing one or multiple wounds []  - 0 Application of Medications - injection INTERVENTIONS - Miscellaneous []  - External ear exam 0 []  - 0 Specimen Collection (cultures, biopsies, blood, body fluids, etc.) []  - 0 Specimen(s) / Culture(s) sent or taken to Lab for analysis []  - 0 Patient Transfer (multiple staff / Lift / Similar devices) []  - 0 Simple Staple / Suture removal (25 or less) []  - 0 Complex Staple / Suture removal (26 or more) []  - 0 Hypo / Hyperglycemic Management (close monitor of Blood Glucose) []  - 0 Ankle / Brachial Index (ABI) - do not check if billed separately Has the patient been seen at the hospital within the last three years: Yes Total Score: 100 Level Of Care: New/Established - Level 3 Electronic Signature(s) Signed: 08/06/2017 4:30:41 PM By: Entered By: on 08/06/2017 15:29:31 ( ) -------------------------------------------------------------------------------- Encounter Discharge Information Details Patient Name: Debbie Parrish Date of Service: 08/06/2017 12:30 PM Medical Record Number: Patient Account Number: Date of Birth/Sex: 10-08-1926 (84 y.o. Female) Treating RN: Renne Crigler Primary Care Norinne Jeane: Renne Crigler Other Clinician: Referring Kenyatta Keidel: Referral, Self Treating  Anan Dapolito/Extender: 08/08/2017, HOYT Weeks in Treatment: 0 Encounter Discharge Information Items Schedule Follow-up Appointment: No Medication Reconciliation completed and No provided to Patient/Care Genevie Elman: Patient Clinical Summary of Care: Declined Electronic Signature(s) Signed: 08/08/2017 8:37:20 AM By: [id number] Entered By: Debbie Parrish on 08/06/2017 14:00:10 [id number] ([id number]) -------------------------------------------------------------------------------- Lower Extremity Assessment Details Patient Name: Debbie Parrish Date of Service: 08/06/2017 12:30 PM Medical Record Number: Renne Crigler Patient Account Number: Donata Duff Date of Birth/Sex: 05/26/1927 (84 y.o. Female) Treating RN: Gwenlyn Perking Primary Care Hazen Brumett: Gwenlyn Perking Other Clinician: Referring Nashonda Limberg: Referral, Self Treating Keisuke Hollabaugh/Extender: STONE III, HOYT Weeks in Treatment: 0 Edema Assessment Assessed: [Left: No] [Right: No] Edema: [Left: Ye] [Right: s] Vascular Assessment Claudication: Claudication Assessment [Right:None] Pulses: Dorsalis Pedis Palpable: [Right:Yes] Doppler Audible: [Right:Yes] Posterior Tibial Palpable: [Right:Yes] Doppler Audible: [Right:Yes] Extremity colors, hair growth, and conditions: Extremity Color: [Right:Mottled] Hair Growth on Extremity: [Right:Yes] Temperature of Extremity: [Right:Cool] Capillary Refill: [Right:> 3 seconds] Toe Nail Assessment Left: Right: Thick: No Discolored: No Deformed: No Improper Length and Hygiene: No Notes ABI non-compressible Electronic Signature(s) Signed: 08/06/2017 4:30:41 PM By: Debbie Parrish Entered By: [id number] on 08/06/2017 13:04:53 08/08/2017 ([id number]) -------------------------------------------------------------------------------- Multi Wound Chart Details Patient Name: [id number] Date of Service: 08/06/2017 12:30 PM Medical Record Number: [id number] Patient Account Number:  Renne Crigler Date of Birth/Sex: Dec 29, 1926 (84 y.o. Female) Treating RN: Renne Crigler Primary Care Aaren Krog: Renne Crigler Other Clinician: Referring Merrilyn Legler: Referral, Self Treating Brennyn Ortlieb/Extender: STONE III, HOYT Weeks in Treatment: 0 Vital Signs Height(in): 67 Pulse(bpm): 76 Weight(lbs): 122 Blood Pressure(mmHg): 169/90 Body Mass Index(BMI): 19 Temperature(F): 97.9 Respiratory Rate 18 (breaths/min): Photos: [1:No Photos] [N/A:N/A] Wound Location: [1:Right Toe Second] [N/A:N/A] Wounding Event: [1:Gradually Appeared] [N/A:N/A] Primary Etiology: [1:Pressure Ulcer] [N/A:N/A] Date Acquired: [1:05/04/2017] [N/A:N/A] Weeks of Treatment: [1:0] [N/A:N/A] Wound Status: [1:Open] [N/A:N/A] Measurements L x W x D [1:0.5x0.6x0.6] [N/A:N/A] (cm) Area (cm) : [1:0.236] [N/A:N/A] Volume (cm) : [1:0.141] [N/A:N/A] % Reduction in Area: [1:0.00%] [N/A:N/A] % Reduction in Volume: [1:0.00%] [N/A:N/A] Classification: [1:Category/Stage II] [N/A:N/A] Exudate Amount: [1:Small] [N/A:N/A] Exudate Type: [1:Serous] [N/A:N/A] Exudate Color: [1:amber] [N/A:N/A] Granulation Amount: [1:None Present (0%)] [N/A:N/A] Necrotic Amount: [1:Large (67-100%)] [N/A:N/A]  Exposed Structures: [1:Fat Layer (Subcutaneous Tissue) Exposed: Yes Fascia: No Tendon: No Muscle: No Joint: No Bone: No] [N/A:N/A] Epithelialization: [1:None] [N/A:N/A] Periwound Skin Texture: [1:Callus: Yes Excoriation: No Induration: No Crepitus: No Rash: No Scarring: No] [N/A:N/A] Periwound Skin Moisture: [1:Maceration: No Dry/Scaly: No] [N/A:N/A] Periwound Skin Color: [1:Atrophie Blanche: No Cyanosis: No] [N/A:N/A] Ecchymosis: No Erythema: No Hemosiderin Staining: No Mottled: No Pallor: No Rubor: No Temperature: No Abnormality N/A N/A Tenderness on Palpation: No N/A N/A Wound Preparation: Ulcer Cleansing: N/A N/A Rinsed/Irrigated with Saline Topical Anesthetic Applied: Other: lidocaine 4% Treatment Notes Electronic  Signature(s) Signed: 08/06/2017 4:30:41 PM By: Renne Crigler Entered By: Renne Crigler on 08/06/2017 13:21:59 Debbie Parrish ([id number]) -------------------------------------------------------------------------------- Multi-Disciplinary Care Plan Details Patient Name: Debbie Parrish Date of Service: 08/06/2017 12:30 PM Medical Record Number: [id number] Patient Account Number: [id number] Date of Birth/Sex: 09/07/1926 (84 y.o. Female) Treating RN: Renne Crigler Primary Care Uri Turnbough: Donata Duff Other Clinician: Referring Cecilie Heidel: Referral, Self Treating Loys Hoselton/Extender: STONE III, HOYT Weeks in Treatment: 0 Active Inactive ` Orientation to the Wound Care Program Nursing Diagnoses: Knowledge deficit related to the wound healing center program Goals: Patient/caregiver will verbalize understanding of the Wound Healing Center Program Date Initiated: 08/06/2017 Target Resolution Date: 09/06/2017 Goal Status: Active Interventions: Provide education on orientation to the wound center Notes: ` Wound/Skin Impairment Nursing Diagnoses: Impaired tissue integrity Goals: Patient/caregiver will verbalize understanding of skin care regimen Date Initiated: 08/06/2017 Target Resolution Date: 09/06/2017 Goal Status: Active Ulcer/skin breakdown will have a volume reduction of 30% by week 4 Date Initiated: 08/06/2017 Target Resolution Date: 09/06/2017 Goal Status: Active Interventions: Assess patient/caregiver ability to perform ulcer/skin care regimen upon admission and as needed Assess ulceration(s) every visit Treatment Activities: Skin care regimen initiated : 08/06/2017 Notes: Electronic Signature(s) Signed: 08/06/2017 4:30:41 PM By: Renne Crigler Entered By: Renne Crigler on 08/06/2017 13:21:47 Debbie Parrish ([id number]) [place], Kathie Rhodes ([id number]) -------------------------------------------------------------------------------- Pain Assessment Details Patient  Name: Debbie Parrish Date of Service: 08/06/2017 12:30 PM Medical Record Number: [id number] Patient Account Number: [id number] Date of Birth/Sex: 19-Mar-1927 (84 y.o. Female) Treating RN: Renne Crigler Primary Care Karder Goodin: Donata Duff Other Clinician: Referring Kimmy Totten: Referral, Self Treating Maygan Koeller/Extender: STONE III, HOYT Weeks in Treatment: 0 Active Problems Location of Pain Severity and Description of Pain Patient Has Paino No Site Locations Pain Management and Medication Current Pain Management: Electronic Signature(s) Signed: 08/06/2017 4:30:41 PM By: Renne Crigler Entered By: Renne Crigler on 08/06/2017 12:51:11 Debbie Parrish ([id number]) -------------------------------------------------------------------------------- Patient/Caregiver Education Details Patient Name: Debbie Parrish Date of Service: 08/06/2017 12:30 PM Medical Record Number: [id number] Patient Account Number: [id number] Date of Birth/Gender: Oct 31, 1926 (84 y.o. Female) Treating RN: Renne Crigler Primary Care Physician: Donata Duff Other Clinician: Referring Physician: Referral, Self Treating Physician/Extender: Linwood Dibbles, HOYT Weeks in Treatment: 0 Education Assessment Education Provided To: Patient and Caregiver Education Topics Provided Welcome To The Wound Care Center: Handouts: Welcome To The [facility] Methods: Explain/Verbal Responses: State content correctly Wound/Skin Impairment: Methods: Explain/Verbal Responses: State content correctly Electronic Signature(s) Signed: 08/06/2017 4:30:41 PM By: Renne Crigler Entered By: Renne Crigler on 08/06/2017 15:31:12 Debbie Parrish ([id number]) -------------------------------------------------------------------------------- Wound Assessment Details Patient Name: Debbie Parrish Date of Service: 08/06/2017 12:30 PM Medical Record Number: [id number] Patient Account Number: [id number] Date of Birth/Sex: 07-Nov-1926 (84  y.o. Female) Treating RN: Renne Crigler Primary Care Chaneka Trefz: Donata Duff Other Clinician: Referring Ozzy Bohlken: Referral, Self Treating Vannary Greening/Extender: STONE III, HOYT Weeks in Treatment: 0 Wound Status Wound Number: 1 Primary Etiology: Pressure Ulcer Wound Location: Right Toe Second Wound Status: Open Wounding Event: Gradually Appeared  Comorbid Cataracts, Arrhythmia, Rheumatoid History: Arthritis Date Acquired: 05/04/2017 Weeks Of Treatment: 0 Clustered Wound: No Photos Photo Uploaded By: Renne Crigler on 08/06/2017 16:13:41 Wound Measurements Length: (cm) 0.5 Width: (cm) 0.6 Depth: (cm) 0.6 Area: (cm) 0.236 Volume: (cm) 0.141 % Reduction in Area: 0% % Reduction in Volume: 0% Epithelialization: None Tunneling: No Undermining: No Wound Description Classification: Category/Stage IV Foul Wound Margin: Distinct, outline attached Slou Exudate Amount: Small Exudate Type: Serous Exudate Color: amber Odor After Cleansing: No gh/Fibrino Yes Wound Bed Granulation Amount: Small (1-33%) Exposed Structure Granulation Quality: Pink Fascia Exposed: No Necrotic Amount: Large (67-100%) Fat Layer (Subcutaneous Tissue) Exposed: Yes Necrotic Quality: Adherent Slough Tendon Exposed: No Muscle Exposed: No Joint Exposed: No Bone Exposed: Yes Periwound Skin Texture Gacek, Diavian ([id number]) Texture Color No Abnormalities Noted: No No Abnormalities Noted: No Callus: Yes Atrophie Blanche: No Crepitus: No Cyanosis: No Excoriation: No Ecchymosis: No Induration: No Erythema: No Rash: No Hemosiderin Staining: No Scarring: No Mottled: No Pallor: No Moisture Rubor: No No Abnormalities Noted: No Dry / Scaly: No Temperature / Pain Maceration: No Temperature: No Abnormality Wound Preparation Ulcer Cleansing: Rinsed/Irrigated with Saline Topical Anesthetic Applied: Other: lidocaine 4%, Treatment Notes Wound #1 (Right Toe Second) 1. Cleansed with: Clean  wound with Normal Saline May Shower, gently pat wound dry prior to applying new dressing. 2. Anesthetic Topical Lidocaine 4% cream to wound bed prior to debridement 4. Dressing Applied: Other dressing (specify in notes) 5. Secondary Dressing Applied Foam Notes silvercell with foam pad between great to e and second toe, gauze wrap the second toe only Electronic Signature(s) Signed: 08/06/2017 4:30:41 PM By: Renne Crigler Signed: 08/08/2017 11:24:19 AM By: Lenda Kelp PA-C Entered By: Lenda Kelp on 08/06/2017 16:04:54 Debbie Parrish ([id number]) -------------------------------------------------------------------------------- Vitals Details Patient Name: Debbie Parrish Date of Service: 08/06/2017 12:30 PM Medical Record Number: [id number] Patient Account Number: [id number] Date of Birth/Sex: [birth year] (84 y.o. Female) Treating RN: Renne Crigler Primary Care Leiyah Maultsby: Donata Duff Other Clinician: Referring Symphany Fleissner: Referral, Self Treating Juno Bozard/Extender: STONE III, HOYT Weeks in Treatment: 0 Vital Signs Time Taken: 12:51 Temperature (F): 97.9 Height (in): 67 Pulse (bpm): 76 Source: Measured Respiratory Rate (breaths/min): 18 Weight (lbs): 122 Blood Pressure (mmHg): 169/90 Source: Measured Reference Range: 80 - 120 mg / dl Body Mass Index (BMI): 19.1 Electronic Signature(s) Signed: 08/06/2017 4:30:41 PM By: Renne Crigler Entered By: Renne Crigler on 08/06/2017 12:52:19

## 2017-08-13 ENCOUNTER — Ambulatory Visit: Payer: Medicare Other | Admitting: Physician Assistant

## 2018-04-05 ENCOUNTER — Ambulatory Visit (HOSPITAL_COMMUNITY): Payer: Medicare Other | Admitting: Psychiatry

## 2019-10-24 ENCOUNTER — Encounter: Payer: Self-pay | Admitting: Neurology

## 2020-02-03 ENCOUNTER — Other Ambulatory Visit: Payer: Self-pay

## 2020-02-03 ENCOUNTER — Encounter: Payer: Self-pay | Admitting: Neurology

## 2020-02-03 ENCOUNTER — Ambulatory Visit (INDEPENDENT_AMBULATORY_CARE_PROVIDER_SITE_OTHER): Payer: Medicare Other | Admitting: Neurology

## 2020-02-03 VITALS — BP 106/59 | HR 78 | Resp 20 | Ht 68.0 in | Wt 107.0 lb

## 2020-02-03 DIAGNOSIS — F0390 Unspecified dementia without behavioral disturbance: Secondary | ICD-10-CM

## 2020-02-03 DIAGNOSIS — R413 Other amnesia: Secondary | ICD-10-CM

## 2020-02-03 DIAGNOSIS — F03A0 Unspecified dementia, mild, without behavioral disturbance, psychotic disturbance, mood disturbance, and anxiety: Secondary | ICD-10-CM

## 2020-02-03 NOTE — Patient Instructions (Addendum)
1. Bloodwork for TSH, B12 2. Schedule routine EEG 3. Continue Donepezil 10mg  daily 4. Continue close supervision 5. Recommend no driving, but if you would drive, have someone with you at all times 6. Follow-up in 6 months, call for any changes  Your provider has requested that you have labwork completed today. Please go to [facility] ([id number]) on the second floor of this building before leaving the office today. You do not need to check in. If you are not called within 15 minutes please check with the front desk.

## 2020-02-03 NOTE — Progress Notes (Signed)
NEUROLOGY CONSULTATION NOTE  Debbie Parrish MRN: [id number] DOB: 1926/12/24  Referring provider: Almira Coaster, PA Primary care provider: Dr. Donata Duff  Reason for consult:  Memory loss  Thank you for your kind referral of Debbie Parrish for consultation of the above symptoms. Although her history is well known to you, please allow me to reiterate it for the purpose of our medical record. The patient was accompanied to the clinic by her son who also provides collateral information. Records and images were personally reviewed where available.   HISTORY OF PRESENT ILLNESS: This is a 86 year old right-handed woman with a history of hypertension, hypothyroidism, atrial fibrillation on Xarelto, rheumatoid arthritis, presenting for evaluation of memory loss. She feels her memory is "great, I remember everything." Her son Debbie Parrish is present during the visit and is concerned that her memory change started rather abruptly, "like night and day." He states the minute before this happened, she was so sharp, ambulating independently. He states she is an incredibly bright woman, then after a fall in January, there was sudden change. He states that the day prior to her fall, she could have gone in front of the [employer] making a case. She is able to answer the questions on [employer], but now is slower. She had a fall on 08/21/19 where she hurt her left shoulder and required surgery on her left hip. She had a left scalp hematoma. Records reviewed, she had passed out while pushing a table. She was orthostatic in the hospital.  He states her reasoning is great, she makes a lot of sense. Charles fixes her medications and reminds her to take them, she states "oh really, when?" and he reminds her. She wants to check her mail repeatedly. She forgets family relationships at times, especially at night. She thinks they are at the beach, wanting to go to her hometown. This morning she asked him if her husband was still  asleep, in the office today she states he has been dead since 12/22/2012. She has an aide Debbie Parrish coming twice a week, she says "who is Debbie Parrish?" then remembers after her son reminds her. She is able to shower independently, Debbie Parrish watches her and makes she she gets her food. She struggles to eat. Debbie Parrish has always helped her just to make sure she is fine, he has been staying with her at night for a number of years to keep track of her, however since her fall, he has needed to be there during the daytime as well. She only drove one time after her fall, she drove a month ago. He feels that if she has a reminders like a post-it note, she can get out and go to church. She was not getting lost previously. She is a retired Child psychotherapist. Mood is good, she is in great spirits. No hallucinations or paranoia. She is on Donepezil 10mg  daily without side effects.  She has occasional migraines and takes prn Tylenol. If headaches are BP-related, she takes prn amlodipine. She has numbness in her hands with the headaches. She has occasional back pain. She denies constipation, then Debbie Parrish reminds her she was constipated yesterday. She denies any dizziness, diplopia, dysarthria/dysphagia, neck pain, bladder incontinence. She sleeps on her couch in the den. Sleep is good. No paranoia or hallucinations. No family history of dementia. She denies any alcohol use. She states "I don't like exercise," she has always been active but over the past 3 weeks has been more sedentary. She uses her walker sometimes,  or Debbie Parrish holds on to her.   She had an MRI brain without contrast at [facility] in 10/2019 which did not show any acute changes, there was moderate chronic microvascular dsiease, small old lacunar-type ischemic infarct in the right caudate, moderate diffuse cerebral and cerebellar atrophy.    PAST MEDICAL HISTORY: Past Medical History:  Diagnosis Date  . AAA (abdominal aortic aneurysm) (HCC)   . Compression fx,  lumbar spine (HCC)   . Hypothyroidism   . Lung nodules   . Paroxysmal atrial fibrillation (HCC)    with rapid ventricular response  . Rheumatoid arthritis(714.0)     PAST SURGICAL HISTORY: Past Surgical History:  Procedure Laterality Date  . ABDOMINAL HYSTERECTOMY    . CHOLECYSTECTOMY    . TONSILLECTOMY      MEDICATIONS: Current Outpatient Medications on File Prior to Visit  Medication Sig Dispense Refill  . donepezil (ARICEPT) 10 MG tablet Take 10 mg by mouth at bedtime.    Marland Kitchen leflunomide (ARAVA) 10 MG tablet 1 tablet a day    . levothyroxine (SYNTHROID, LEVOTHROID) 100 MCG tablet     . Metoprolol Succinate 50 MG CS24 Take by mouth. 1/2 tablet ??    . predniSONE (DELTASONE) 5 MG tablet Take 5 mg by mouth daily with breakfast. TAKE 1/2 TABLET DAILY    . rOPINIRole (REQUIP) 0.5 MG tablet Take 1 tablet by mouth daily.    [name] 15 MG TABS tablet TAKE ONE TABLET BY MOUTH EVERY DAY 30 tablet 1  . atorvastatin (LIPITOR) 20 MG tablet Take 1 tablet (20 mg total) by mouth daily. (Patient not taking: Reported on 02/03/2020) 30 tablet 0  . atorvastatin (LIPITOR) 20 MG tablet TAKE ONE TABLET BY MOUTH ONCE DAILY (PATIENT  NEEDS  TO  CALL  AND  SCHEDULE  APPOINTMENT  BEFORE  ANY  FUTURE  REFILLS  WILL  BE  SENT) (Patient not taking: Reported on 02/03/2020) 7 tablet 0   No current facility-administered medications on file prior to visit.    ALLERGIES: Allergies  Allergen Reactions  . Sulfonamide Derivatives     FAMILY HISTORY: History reviewed. No pertinent family history.  SOCIAL HISTORY: Social History   Socioeconomic History  . Marital status: Married    Spouse name: Not on file  . Number of children: Not on file  . Years of education: Not on file  . Highest education level: Not on file  Occupational History  . Occupation: Retired  Tobacco Use  . Smoking status: Former Smoker    Quit date: 08/08/1987    Years since quitting: 32.5  . Smokeless tobacco: Never Used  Substance  and Sexual Activity  . Alcohol use: No  . Drug use: No  . Sexual activity: Not on file  Other Topics Concern  . Not on file  Social History Narrative   Right handed   One story home   Drinks caffeine   Social Determinants of Health   Financial Resource Strain:   . Difficulty of Paying Living Expenses:   Food Insecurity:   . Worried About Programme researcher, broadcasting/film/video in the Last Year:   . Barista in the Last Year:   Transportation Needs:   . Freight forwarder (Medical):   Marland Kitchen Lack of Transportation (Non-Medical):   Physical Activity:   . Days of Exercise per Week:   . Minutes of Exercise per Session:   Stress:   . Feeling of Stress :   Social Connections:   .  Frequency of Communication with Friends and Family:   . Frequency of Social Gatherings with Friends and Family:   . Attends Religious Services:   . Active Member of Clubs or Organizations:   . Attends Banker Meetings:   Marland Kitchen Marital Status:   Intimate Partner Violence:   . Fear of Current or Ex-Partner:   . Emotionally Abused:   Marland Kitchen Physically Abused:   . Sexually Abused:     PHYSICAL EXAM: Vitals:   02/03/20 1409  BP: (!) 106/59  Pulse: 78  Resp: 20  SpO2: 95%   General: No acute distress Head:  Normocephalic/atraumatic Skin/Extremities: No rash, no edema Neurological Exam: Mental status: alert and oriented to person, states. Year is 20-something. No dysarthria or aphasia, Fund of knowledge is reduced. Recent and remote memory are impaired.  Attention and concentration are reduced. SLUMS score 7/30.  [employer] Mental Exam 02/03/2020  Weekday Correct 0  Current year 0  What state are we in? 1  Amount spent 0  Amount left 0  # of Animals 1  5 objects recall 0  Number series 1  Hour markers 0  Time correct 0  Placed X in triangle correctly 1  Largest Figure 1  Name of female 0  Date back to work 2  Type of work 0  State she lived in 0  Total score 7   . Cranial nerves: CN  I: not tested CN II: pupils equal, round and reactive to light, visual fields intact CN III, IV, VI:  full range of motion, no nystagmus, no ptosis CN V: facial sensation intact CN VII: upper and lower face symmetric CN VIII: hearing intact to conversation Bulk & Tone: normal, no fasciculations. Motor: 5/5 throughout with no pronator drift. Sensation: intact to light touch, cold, pin on both UE and LE, decreased vibration sense to right knee.  Deep Tendon Reflexes: unable to elicit reflexes Cerebellar: no incoordination on finger to nose testing Gait: unsteady without assistance Tremor: none  IMPRESSION: This is a 86 year old right-handed woman with a history of hypertension, hypothyroidism, atrial fibrillation on Xarelto, rheumatoid arthritis, presenting for evaluation of memory loss. Her son reports that memory changes occurred rather suddenly after her fall in January 2021. MRI brain in March 2021 did not show any acute changes, there is diffuse atrophy and chronic microvascular disease. SLUMS score today 7/30, neurological exam non-focal, gait unsteady. We discussed different causes of memory loss, check TSH and B12. A routine EEG will be done. Sudden cognitive changes can also be seen with mood changes, however this does not appear to be the case by son's account. Post-concussion syndrome from head injury with fall is also a consideration. Treatment is supportive, continue Donepezil 10mg  daily and close supervision. I discussed no driving, however her son feels she can do it as long as he is present at all times, continue to monitor. Follow-up in 6 months, they know to call for any changes.   Thank you for allowing me to participate in the care of this patient. Please do not hesitate to call for any questions or concerns.   , M.D.  CC: Dr. Patrcia Dolly

## 2020-02-20 ENCOUNTER — Telehealth: Payer: Self-pay | Admitting: Neurology

## 2020-02-20 NOTE — Telephone Encounter (Signed)
See other phone note

## 2020-02-20 NOTE — Telephone Encounter (Signed)
Returning a call to the office

## 2020-02-20 NOTE — Telephone Encounter (Signed)
Pt son  Called no answer voice mail left to call office back

## 2020-02-20 NOTE — Telephone Encounter (Signed)
Received B12 results from 02/12/2020: 341

## 2020-02-20 NOTE — Telephone Encounter (Signed)
Spoke to pt son and informed him of pt B12 results. Verbalized understanding,

## 2020-12-05 DEATH — deceased
